# Patient Record
Sex: Female | Born: 1980 | Race: Black or African American | Hispanic: No | Marital: Single | Smoking: Current some day smoker
Health system: Southern US, Community
[De-identification: ages and names within clinical notes are randomized; demographics above are authoritative.]

## PROBLEM LIST (undated history)

## (undated) DIAGNOSIS — G43909 Migraine, unspecified, not intractable, without status migrainosus: Secondary | ICD-10-CM

## (undated) DIAGNOSIS — R569 Unspecified convulsions: Secondary | ICD-10-CM

## (undated) DIAGNOSIS — IMO0002 Reserved for concepts with insufficient information to code with codable children: Secondary | ICD-10-CM

## (undated) DIAGNOSIS — R87629 Unspecified abnormal cytological findings in specimens from vagina: Secondary | ICD-10-CM

## (undated) DIAGNOSIS — R42 Dizziness and giddiness: Secondary | ICD-10-CM

## (undated) DIAGNOSIS — F319 Bipolar disorder, unspecified: Secondary | ICD-10-CM

## (undated) HISTORY — DX: Unspecified abnormal cytological findings in specimens from vagina: R87.629

## (undated) HISTORY — PX: BREAST LUMPECTOMY: SHX2

## (undated) HISTORY — PX: BRAIN SURGERY: SHX531

## (undated) HISTORY — PX: ABDOMINAL HYSTERECTOMY: SHX81

---

## 2010-08-27 ENCOUNTER — Emergency Department (HOSPITAL_COMMUNITY): Admission: EM | Admit: 2010-08-27 | Discharge: 2010-08-27 | Payer: Self-pay | Admitting: Emergency Medicine

## 2010-11-25 ENCOUNTER — Emergency Department (HOSPITAL_COMMUNITY)
Admission: EM | Admit: 2010-11-25 | Discharge: 2010-11-25 | Payer: Self-pay | Source: Home / Self Care | Admitting: Emergency Medicine

## 2011-02-15 LAB — DIFFERENTIAL
Monocytes Relative: 7 % (ref 3–12)
Neutrophils Relative %: 64 % (ref 43–77)

## 2011-02-15 LAB — CBC
HCT: 39.1 % (ref 36.0–46.0)
Hemoglobin: 13.1 g/dL (ref 12.0–15.0)
MCH: 27.3 pg (ref 26.0–34.0)
MCHC: 33.5 g/dL (ref 30.0–36.0)
MCV: 81.5 fL (ref 78.0–100.0)
Platelets: 253 10*3/uL (ref 150–400)
RBC: 4.8 MIL/uL (ref 3.87–5.11)
RDW: 14.9 % (ref 11.5–15.5)
WBC: 8.6 10*3/uL (ref 4.0–10.5)

## 2011-02-15 LAB — URINALYSIS, ROUTINE W REFLEX MICROSCOPIC
Bilirubin Urine: NEGATIVE
Glucose, UA: NEGATIVE mg/dL
Ketones, ur: NEGATIVE mg/dL
Protein, ur: NEGATIVE mg/dL
Specific Gravity, Urine: 1.015 (ref 1.005–1.030)
pH: 7 (ref 5.0–8.0)

## 2011-02-15 LAB — BASIC METABOLIC PANEL
BUN: 7 mg/dL (ref 6–23)
GFR calc Af Amer: >60 mL/min (ref >60)
GFR calc non Af Amer: >60 mL/min (ref >60)

## 2011-02-18 LAB — POCT I-STAT, CHEM 8
BUN: 9 mg/dL (ref 6–23)
Chloride: 108 mEq/L (ref 96–112)
Creatinine, Ser: 0.9 mg/dL (ref 0.4–1.2)
Potassium: 3.7 mEq/L (ref 3.5–5.1)
Sodium: 142 mEq/L (ref 135–145)

## 2011-02-18 LAB — ETHANOL: Alcohol, Ethyl (B): <5 mg/dL (ref 0–10)

## 2011-05-07 ENCOUNTER — Emergency Department (HOSPITAL_COMMUNITY)
Admission: EM | Admit: 2011-05-07 | Discharge: 2011-05-08 | Disposition: A | Discharge status: Home / Self Care | Payer: BC Managed Care – PPO | Attending: Emergency Medicine | Admitting: Emergency Medicine

## 2011-05-07 DIAGNOSIS — R42 Dizziness and giddiness: Secondary | ICD-10-CM | POA: Insufficient documentation

## 2011-05-07 DIAGNOSIS — F319 Bipolar disorder, unspecified: Secondary | ICD-10-CM | POA: Insufficient documentation

## 2011-05-07 DIAGNOSIS — Z91199 Patient's noncompliance with other medical treatment and regimen due to unspecified reason: Secondary | ICD-10-CM | POA: Insufficient documentation

## 2011-05-07 DIAGNOSIS — Z9119 Patient's noncompliance with other medical treatment and regimen: Secondary | ICD-10-CM | POA: Insufficient documentation

## 2011-05-07 DIAGNOSIS — F411 Generalized anxiety disorder: Secondary | ICD-10-CM | POA: Insufficient documentation

## 2011-05-07 DIAGNOSIS — R569 Unspecified convulsions: Secondary | ICD-10-CM | POA: Insufficient documentation

## 2011-05-07 LAB — COMPREHENSIVE METABOLIC PANEL
Albumin: 3.9 g/dL (ref 3.5–5.2)
BUN: 16 mg/dL (ref 6–23)
CO2: 28 mEq/L (ref 19–32)
Calcium: 9.4 mg/dL (ref 8.4–10.5)
Creatinine, Ser: 0.98 mg/dL (ref 0.4–1.2)
GFR calc non Af Amer: >60 mL/min (ref >60)
Total Bilirubin: 0.2 mg/dL — ABNORMAL LOW (ref 0.3–1.2)
Total Protein: 7.3 g/dL (ref 6.0–8.3)

## 2011-05-07 LAB — URINALYSIS, ROUTINE W REFLEX MICROSCOPIC
Bilirubin Urine: NEGATIVE
Glucose, UA: NEGATIVE mg/dL
Hgb urine dipstick: NEGATIVE
Protein, ur: NEGATIVE mg/dL
Specific Gravity, Urine: 1.027 (ref 1.005–1.030)
Urobilinogen, UA: 0.2 mg/dL (ref 0.0–1.0)
pH: 6.5 (ref 5.0–8.0)

## 2011-05-07 LAB — DIFFERENTIAL
Eosinophils Absolute: 0.2 10*3/uL (ref 0.0–0.7)
Eosinophils Relative: 2 % (ref 0–5)
Monocytes Absolute: 0.8 10*3/uL (ref 0.1–1.0)
Monocytes Relative: 6 % (ref 3–12)
Neutrophils Relative %: 64 % (ref 43–77)

## 2011-05-07 LAB — CBC
HCT: 39.6 % (ref 36.0–46.0)
MCH: 27 pg (ref 26.0–34.0)
MCHC: 33.6 g/dL (ref 30.0–36.0)
Platelets: 258 10*3/uL (ref 150–400)
RBC: 4.93 MIL/uL (ref 3.87–5.11)
RDW: 14.8 % (ref 11.5–15.5)

## 2011-08-10 ENCOUNTER — Inpatient Hospital Stay (INDEPENDENT_AMBULATORY_CARE_PROVIDER_SITE_OTHER)
Admission: RE | Admit: 2011-08-10 | Discharge: 2011-08-10 | Disposition: A | Discharge status: Home / Self Care | Payer: BC Managed Care – PPO | Source: Ambulatory Visit | Attending: Emergency Medicine | Admitting: Emergency Medicine

## 2011-08-10 ENCOUNTER — Emergency Department (HOSPITAL_COMMUNITY)
Admission: EM | Admit: 2011-08-10 | Discharge: 2011-08-10 | Disposition: A | Discharge status: Home / Self Care | Payer: BC Managed Care – PPO | Attending: Emergency Medicine | Admitting: Emergency Medicine

## 2011-08-10 DIAGNOSIS — R51 Headache: Secondary | ICD-10-CM

## 2011-08-10 DIAGNOSIS — G43909 Migraine, unspecified, not intractable, without status migrainosus: Secondary | ICD-10-CM | POA: Insufficient documentation

## 2011-08-10 DIAGNOSIS — R42 Dizziness and giddiness: Secondary | ICD-10-CM

## 2011-08-10 DIAGNOSIS — R569 Unspecified convulsions: Secondary | ICD-10-CM | POA: Insufficient documentation

## 2011-08-11 ENCOUNTER — Emergency Department (HOSPITAL_COMMUNITY)
Admission: EM | Admit: 2011-08-11 | Discharge: 2011-08-11 | Disposition: A | Discharge status: Home / Self Care | Payer: BC Managed Care – PPO | Attending: Emergency Medicine | Admitting: Emergency Medicine

## 2011-08-11 DIAGNOSIS — R42 Dizziness and giddiness: Secondary | ICD-10-CM | POA: Insufficient documentation

## 2011-08-11 DIAGNOSIS — R110 Nausea: Secondary | ICD-10-CM | POA: Insufficient documentation

## 2011-08-11 DIAGNOSIS — G43909 Migraine, unspecified, not intractable, without status migrainosus: Secondary | ICD-10-CM | POA: Insufficient documentation

## 2011-08-11 DIAGNOSIS — G40909 Epilepsy, unspecified, not intractable, without status epilepticus: Secondary | ICD-10-CM | POA: Insufficient documentation

## 2011-08-11 DIAGNOSIS — Z79899 Other long term (current) drug therapy: Secondary | ICD-10-CM | POA: Insufficient documentation

## 2011-08-14 ENCOUNTER — Emergency Department (HOSPITAL_COMMUNITY)
Admission: EM | Admit: 2011-08-14 | Discharge: 2011-08-14 | Disposition: A | Discharge status: Home / Self Care | Payer: BC Managed Care – PPO | Attending: Emergency Medicine | Admitting: Emergency Medicine

## 2011-08-14 DIAGNOSIS — Z79899 Other long term (current) drug therapy: Secondary | ICD-10-CM | POA: Insufficient documentation

## 2011-08-14 DIAGNOSIS — R51 Headache: Secondary | ICD-10-CM | POA: Insufficient documentation

## 2011-08-14 DIAGNOSIS — G40909 Epilepsy, unspecified, not intractable, without status epilepticus: Secondary | ICD-10-CM | POA: Insufficient documentation

## 2011-08-14 DIAGNOSIS — R110 Nausea: Secondary | ICD-10-CM | POA: Insufficient documentation

## 2012-06-01 ENCOUNTER — Emergency Department (HOSPITAL_COMMUNITY): Payer: BC Managed Care – PPO

## 2012-06-01 ENCOUNTER — Encounter (HOSPITAL_COMMUNITY): Payer: Self-pay

## 2012-06-01 ENCOUNTER — Emergency Department (HOSPITAL_COMMUNITY)
Admission: EM | Admit: 2012-06-01 | Discharge: 2012-06-01 | Disposition: A | Discharge status: Home / Self Care | Payer: BC Managed Care – PPO | Attending: Emergency Medicine | Admitting: Emergency Medicine

## 2012-06-01 DIAGNOSIS — R42 Dizziness and giddiness: Secondary | ICD-10-CM | POA: Insufficient documentation

## 2012-06-01 DIAGNOSIS — Z79899 Other long term (current) drug therapy: Secondary | ICD-10-CM | POA: Insufficient documentation

## 2012-06-01 DIAGNOSIS — R55 Syncope and collapse: Secondary | ICD-10-CM

## 2012-06-01 HISTORY — DX: Unspecified convulsions: R56.9

## 2012-06-01 HISTORY — DX: Dizziness and giddiness: R42

## 2012-06-01 HISTORY — DX: Migraine, unspecified, not intractable, without status migrainosus: G43.909

## 2012-06-01 LAB — POCT I-STAT, CHEM 8
BUN: 9 mg/dL (ref 6–23)
Calcium, Ion: 1.31 mmol/L (ref 1.12–1.32)
Glucose, Bld: 66 mg/dL — ABNORMAL LOW (ref 70–99)
HCT: 42 % (ref 36.0–46.0)
Potassium: 3.9 mEq/L (ref 3.5–5.1)
Sodium: 143 mEq/L (ref 135–145)

## 2012-06-01 LAB — URINALYSIS, ROUTINE W REFLEX MICROSCOPIC
Ketones, ur: NEGATIVE mg/dL
Leukocytes, UA: NEGATIVE
pH: 7 (ref 5.0–8.0)

## 2012-06-01 MED ORDER — IOHEXOL 350 MG/ML SOLN
50.0000 mL | Freq: Once | INTRAVENOUS | Status: AC | PRN
  Administered 2012-06-01: 50 mL via INTRAVENOUS

## 2012-06-01 MED ORDER — MECLIZINE HCL 25 MG PO TABS
50.0000 mg | ORAL_TABLET | Freq: Once | ORAL | Status: AC
  Administered 2012-06-01: 50 mg via ORAL
  Filled 2012-06-01: qty 2

## 2012-06-01 NOTE — Discharge Instructions (Signed)
Please read and follow all provided instructions.  Your diagnoses today include:  1. Syncope     Tests performed today include:  Blood counts and electrolytes  CT of your head and neck - was normal  Vital signs. See below for your results today.   Medications prescribed:   None  Home care instructions:  Follow any educational materials contained in this packet.   Follow-up instructions: Please follow-up with your primary care provider in the next 3 days for further evaluation of your symptoms. If you do not have a primary care doctor -- see below for referral information.   Return instructions:   Please return to the Emergency Department if you experience worsening symptoms.   Return if you pass out again or have persistent vomiting  Return if you have worsening problems walking  Please return if you have any other emergent concerns.  Additional Information:  Your vital signs today were: BP 110/67  Pulse 85  Temp 98.5 F (36.9 C) (Oral)  Resp 16  SpO2 100% If your blood pressure (BP) was elevated above 135/85 this visit, please have this repeated by your doctor within one month. -------------- No Primary Care Doctor Call [facility]  [phone] Other agencies that provide inexpensive medical care    [facility]  [phone]    [facility]  [phone]    [facility]  [phone]    [facility]  [phone]    [facility]  [phone]    [facility]  [phone] -------------- RESOURCE GUIDE:  Dental Problems  Patients with Medicaid: [facility] [phone] [address]                                            [phone] [address] Phone:  [phone]                                                   Phone:  [phone]  If unable to pay or uninsured, contact:  [facility] or [facility] to become qualified for the adult dental clinic.  Chronic Pain  Problems Contact [facility]  [phone] Patients need to be referred by their primary care doctor.  Insufficient Money for Medicine Contact [employer]:  call "211" or [facility] [phone].  Psychological Services [facility]  [phone] [facility]  [phone] [facility]   [phone] (emergency services [phone])  Substance Abuse Resources [facility]  [phone] [facility] [phone] [facility] [place] [phone] Floydene Flock [phone] Residential & Outpatient Substance Abuse Program  [phone]  Abuse/Neglect [facility] Child Abuse Hotline [phone] [facility] Child Abuse Hotline [phone] (After Hours)  Emergency Shelter [facility] [phone]  [facility] [phone] [facility] [facility] [phone]  [facility] [place]  [facility] [address] [place]                       [address]      [address]  [place]                                                [place]                            [place] Phone:  [phone]                                   Phone:  [phone]                 Phone:  [phone]  [facility] Phone:  [phone]  [place] Child Abuse Hotline [phone]) 342-1394 [phone] (After Hours)    

## 2012-06-01 NOTE — ED Provider Notes (Signed)
Medical screening examination/treatment/procedure(s) were performed by non-physician practitioner and as supervising physician I was immediately available for consultation/collaboration.   Hubbert Landrigan A Maelynn Moroney, MD 06/01/12 1647 

## 2012-06-01 NOTE — ED Notes (Signed)
Pt reports approx 2 weeks of vertigo/dizziness symptoms with standing. Seen by PMD last week and had changes made to her migraine medications. States she took the week off work because she just wasn't feeling herself. Pt reports seizure at home two days ago. Unwitnessed. Pt reports she fell and did hit her head but was home alone, only with 29 yo daughter. Today went back to work and while standing became dizzy and had syncopal event. Pt reports changes to gait and walking into things. A&0x4. Neuro without deficit. Seizure precautions in place.

## 2012-06-01 NOTE — ED Notes (Signed)
Pt complains of syncopal episode today at work and headache, had a seizure 23 days ago.

## 2012-06-01 NOTE — ED Provider Notes (Signed)
History     CSN: [id number]  Arrival date & time 06/01/12  1048   First MD Initiated Contact with Patient 06/01/12 1149      Chief Complaint  Patient presents with  . Near Syncope    (Consider location/radiation/quality/duration/timing/severity/associated sxs/prior treatment) HPI Comments: Patient with a history of seizures, migraines and vertigo -- reports after a syncopal episode this morning at work. She states she had been feeling tired and "not herself" all morning before the episode. This was the patient's first day back at work for over a week because she had been having multiple episodes of vertigo and fatigue. The patient was standing at work, and the next thing that she remembers is waking up to coworkers standing over her. She felt an episode of vertigo before the episode. She reports that this did not seem like a seizure episode. She was able to stand up and walk around after the syncopal episode. She reports drinking a lot of water each day, and she does not feel dehydrated. She reports a mild headache currently. She denies vomiting, nausea, change in bowel or bladder habits, and CP or SOB. She denies cough, ear pain or fever. She denies weakness, numbness or tingling in her extremities. She reports a seizure 2 days ago that no one witnessed. Patient is taking Keppra as prescribed.   Patient is a 31 y.o. female presenting with syncope. The history is provided by the patient.  Loss of Consciousness This is a new problem. The current episode started today. The problem has been resolved. Associated symptoms include fatigue, headaches and vertigo. Pertinent negatives include no abdominal pain, change in bowel habit, chest pain, coughing, fever, myalgias, nausea, neck pain, rash, sore throat, urinary symptoms or vomiting. The symptoms are aggravated by standing. She has tried position changes for the symptoms. The treatment provided mild relief.    Past Medical History  Diagnosis Date    . Vertigo   . Migraine   . Seizure     No past surgical history on file.  No family history on file.  History  Substance Use Topics  . Smoking status: Not on file  . Smokeless tobacco: Not on file  . Alcohol Use:     OB History    Grav Para Term Preterm Abortions TAB SAB Ect Mult Living                  Review of Systems  Constitutional: Positive for fatigue. Negative for fever.  HENT: Negative for sore throat, rhinorrhea and neck pain.   Eyes: Negative for redness.  Respiratory: Negative for cough.   Cardiovascular: Positive for syncope. Negative for chest pain.  Gastrointestinal: Negative for nausea, vomiting, abdominal pain, diarrhea and change in bowel habit.  Genitourinary: Negative for dysuria.  Musculoskeletal: Negative for myalgias.  Skin: Negative for rash.  Neurological: Positive for dizziness (vertigo), vertigo, syncope and headaches. Negative for seizures (none today).    Allergies  Review of patient's allergies indicates no known allergies.  Home Medications   Current Outpatient Rx  Name Route Sig Dispense Refill  . CLONAZEPAM 1 MG PO TABS Oral Take 1 mg by mouth at bedtime.    Marland Kitchen ESTRADIOL 1 MG PO TABS Oral Take 1 mg by mouth daily.    Marland Kitchen LEVETIRACETAM 1000 MG PO TABS Oral Take 1,000 mg by mouth 2 (two) times daily.    . ADULT MULTIVITAMIN W/MINERALS CH Oral Take 1 tablet by mouth daily.    . QUETIAPINE FUMARATE 200  MG PO TABS Oral Take 200 mg by mouth at bedtime.    Marland Kitchen RIZATRIPTAN BENZOATE 10 MG PO TBDP Oral Take 10 mg by mouth as needed. May repeat in 2 hours if needed      BP 114/76  Pulse 108  Temp 98.5 F (36.9 C) (Oral)  Resp 18  SpO2 98%  Physical Exam  Nursing note and vitals reviewed. Constitutional: She is oriented to person, place, and time. She appears well-developed and well-nourished.  HENT:  Head: Normocephalic and atraumatic.  Right Ear: Tympanic membrane, external ear and ear canal normal.  Left Ear: Tympanic membrane,  external ear and ear canal normal.  Nose: Nose normal.  Mouth/Throat: Uvula is midline and oropharynx is clear and moist. Mucous membranes are dry.  Eyes: Conjunctivae are normal. Pupils are equal, round, and reactive to light. Right eye exhibits no discharge. Left eye exhibits no discharge. Right eye exhibits nystagmus (with rightward gaze). Right eye exhibits normal extraocular motion. Left eye exhibits nystagmus (with rightward gaze). Left eye exhibits normal extraocular motion.  Neck: Normal range of motion. Neck supple.  Cardiovascular: Normal rate, regular rhythm and normal heart sounds.   Pulmonary/Chest: Effort normal and breath sounds normal.  Abdominal: Soft. There is no tenderness.  Musculoskeletal: Normal range of motion. She exhibits no edema and no tenderness.  Neurological: She is alert and oriented to person, place, and time. She displays normal reflexes. No cranial nerve deficit. Coordination normal.  Skin: Skin is warm and dry.  Psychiatric: She has a normal mood and affect.    ED Course  Procedures (including critical care time)  Labs Reviewed - No data to display Ct Angio Head W/cm &/or Wo Cm  06/01/2012  *RADIOLOGY REPORT*  Clinical Data:  Syncope, vertigo, trouble walking  CT ANGIOGRAPHY HEAD AND NECK  Technique:  Multidetector CT imaging of the head and neck was performed using the standard protocol during bolus administration of intravenous contrast.  Multiplanar CT image reconstructions including MIPs were obtained to evaluate the vascular anatomy. Carotid stenosis measurements (when applicable) are obtained utilizing NASCET criteria, using the distal internal carotid diameter as the denominator.  Contrast: 50mL OMNIPAQUE IOHEXOL 350 MG/ML SOLN  Comparison:   None.  CTA NECK  Findings:  Standard branching of the aortic arch.  No significant stenosis involving the great vessels.  No mass is present in the neck.  Cervical spine is normal.  Right carotid:  Common carotid  artery is widely patent.  Carotid bifurcation widely patent.  Negative for dissection or stenosis.  Left carotid:  Normal common carotid  and  normal carotid bifurcation.  Negative for dissection or stenosis.  Vertebral arteries:  Both vertebral arteries are equal in size and widely patent to the basilar without stenosis or dissection.   Review of the MIP images confirms the above findings.  IMPRESSION: Negative CTA  neck.  CTA HEAD  Findings:  Ventricle size is normal.  Negative for infarct mass or hemorrhage.  No enhancing lesions are identified post contrast.  Both vertebral arteries are patent to the basilar.  PICA is patent bilaterally.  Superior cerebellar and posterior cerebral arteries are patent bilaterally without stenosis.  Internal carotid artery is patent bilaterally without stenosis. Anterior and middle cerebral arteries are patent bilaterally without stenosis.  Negative for cerebral aneurysm.   Review of the MIP images confirms the above findings.  IMPRESSION:  Normal  Original Report Authenticated By: Camelia Phenes, M.D.   Ct Angio Neck W/cm &/or Wo/cm  06/01/2012  *  RADIOLOGY REPORT*  Clinical Data:  Syncope, vertigo, trouble walking  CT ANGIOGRAPHY HEAD AND NECK  Technique:  Multidetector CT imaging of the head and neck was performed using the standard protocol during bolus administration of intravenous contrast.  Multiplanar CT image reconstructions including MIPs were obtained to evaluate the vascular anatomy. Carotid stenosis measurements (when applicable) are obtained utilizing NASCET criteria, using the distal internal carotid diameter as the denominator.  Contrast: 50mL OMNIPAQUE IOHEXOL 350 MG/ML SOLN  Comparison:   None.  CTA NECK  Findings:  Standard branching of the aortic arch.  No significant stenosis involving the great vessels.  No mass is present in the neck.  Cervical spine is normal.  Right carotid:  Common carotid artery is widely patent.  Carotid bifurcation widely patent.   Negative for dissection or stenosis.  Left carotid:  Normal common carotid  and  normal carotid bifurcation.  Negative for dissection or stenosis.  Vertebral arteries:  Both vertebral arteries are equal in size and widely patent to the basilar without stenosis or dissection.   Review of the MIP images confirms the above findings.  IMPRESSION: Negative CTA  neck.  CTA HEAD  Findings:  Ventricle size is normal.  Negative for infarct mass or hemorrhage.  No enhancing lesions are identified post contrast.  Both vertebral arteries are patent to the basilar.  PICA is patent bilaterally.  Superior cerebellar and posterior cerebral arteries are patent bilaterally without stenosis.  Internal carotid artery is patent bilaterally without stenosis. Anterior and middle cerebral arteries are patent bilaterally without stenosis.  Negative for cerebral aneurysm.   Review of the MIP images confirms the above findings.  IMPRESSION:  Normal  Original Report Authenticated By: Camelia Phenes, M.D.     1. Syncope     12:35 PM Patient seen and examined. HR=90's.  Vital signs reviewed and are as follows: Filed Vitals:   06/01/12 1105  BP: 114/76  Pulse: 108  Temp: 98.5 F (36.9 C)  Resp: 18   1:23 PM Pt ambulated to bathroom with difficulty walking per tech. She is having trouble walking straight and is bumping into walls. D/w Dr. Golda Acre. Will perform CT head, angio head/neck. Patient informed.    Date: 06/01/2012  Rate: 92  Rhythm: normal sinus rhythm  QRS Axis: normal  Intervals: normal  ST/T Wave abnormalities: normal  Conduction Disutrbances:none  Narrative Interpretation:   Old EKG Reviewed: unchanged 05/07/2011   CT's were negative. Pt informed. Ambulation attempted again and patient did well. She states she is ready to go home. I have urged patient to rest, drink plenty of fluids, return with worsening symptoms or trouble walking, or if she has any other concerns. Urged follow-up with her neurologist in  the next week. Patient verbalizes understanding and agrees with plan.     MDM  Syncope -- neg upreg, nml orthostatics, EKG neg.   Walking difficulty and nystagmus with vertigo was concerning for posterior circulation problem so CT angio neck and head was performed. These were negative. Patient ambulation improved prior to discharge.   Do not suspect cardiogenic or valvular cause of syncope, however there is not a clear etiology at this time. Symptoms are consistent with syncopal episode and not a seizure. Patient is stable and appears well at time of discharge.        [place], [place] 06/01/12 1645

## 2012-06-01 NOTE — ED Notes (Signed)
Went into do orthostatic vital signs but patient was on her way to CT

## 2012-06-04 ENCOUNTER — Emergency Department (HOSPITAL_COMMUNITY)
Admission: EM | Admit: 2012-06-04 | Discharge: 2012-06-04 | Disposition: A | Discharge status: Home / Self Care | Payer: BC Managed Care – PPO | Attending: Emergency Medicine | Admitting: Emergency Medicine

## 2012-06-04 ENCOUNTER — Encounter (HOSPITAL_COMMUNITY): Payer: Self-pay | Admitting: *Deleted

## 2012-06-04 DIAGNOSIS — F17200 Nicotine dependence, unspecified, uncomplicated: Secondary | ICD-10-CM | POA: Insufficient documentation

## 2012-06-04 DIAGNOSIS — G40909 Epilepsy, unspecified, not intractable, without status epilepticus: Secondary | ICD-10-CM | POA: Insufficient documentation

## 2012-06-04 DIAGNOSIS — Z7982 Long term (current) use of aspirin: Secondary | ICD-10-CM | POA: Insufficient documentation

## 2012-06-04 DIAGNOSIS — Z79899 Other long term (current) drug therapy: Secondary | ICD-10-CM | POA: Insufficient documentation

## 2012-06-04 LAB — CBC
Platelets: 231 10*3/uL (ref 150–400)
RBC: 4.9 MIL/uL (ref 3.87–5.11)
WBC: 10.7 10*3/uL — ABNORMAL HIGH (ref 4.0–10.5)

## 2012-06-04 LAB — BASIC METABOLIC PANEL
CO2: 25 mEq/L (ref 19–32)
Calcium: 9.4 mg/dL (ref 8.4–10.5)
Chloride: 103 mEq/L (ref 96–112)
Sodium: 138 mEq/L (ref 135–145)

## 2012-06-04 MED ORDER — HYDROCODONE-ACETAMINOPHEN 5-325 MG PO TABS
1.0000 | ORAL_TABLET | Freq: Once | ORAL | Status: AC
  Administered 2012-06-04: 1 via ORAL
  Filled 2012-06-04: qty 1

## 2012-06-04 NOTE — Discharge Instructions (Signed)
Epilepsy  People with epilepsy have times when they shake and jerk uncontrollably (seizures). This happens when there is a sudden change in brain function. Epilepsy may have many possible causes. Anything that disturbs the normal pattern of brain cell activity can lead to seizures.  HOME CARE    Listen to your doctor about driving and safety during normal activities.   Only take medicine as told by your doctor.   Take blood tests as told by your doctor.   Tell the people you live and work with that you have seizures. Make sure they know how to help you. They should:   Cushion your head and body.   Turn you on your side.   Not restrain you.   Not place anything inside your mouth.   Call for local emergency medical help if there is any question about what has happened.   Write down when your seizures happen and what you remember about each seizure. Write down anything you think may have caused the seizure to happen (trigger).   Keep all follow-up visits with your doctor. This is very important.  GET HELP RIGHT AWAY IF:    You get an infection or start to feel sick. You may have more seizures when you are sick.   You are having seizures more often.   Your seizure pattern is changing.   A seizure does not stop after a few seconds or minutes.   A seizure causes you to have trouble breathing.   A seizure gives you a very bad headache.   A seizure makes you unable to speak or use a part of your body.  MAKE SURE YOU:    Understand these instructions.   Will watch your condition.   Will get help right away if you are not doing well or get worse.  Document Released: 09/19/2009 Document Revised: 11/11/2011 Document Reviewed: 09/19/2009  Exit[employer] Patient Information 2012 [employer].

## 2012-06-04 NOTE — ED Notes (Signed)
Had seizure today-- hx of seizures

## 2012-06-04 NOTE — ED Notes (Addendum)
Pt from home with reports of an un witnessed seizure that happened at around 1330 today. Pt reports that 29 year old daughter was present and told pt that she was shaking. Pt also endorses headache and reports that she usually gets a migraine before she has a seizure. Pt endorses hx of grand mal seizures. Pt reports that last seizure was Wednesday and that she passed out at work on Friday and was taken to [facility] ED for tx. Pt reports that next appt with the Neurologist is 06/15/12. Pt is alert and oriented at present.

## 2012-06-04 NOTE — ED Provider Notes (Signed)
History     CSN: [id number]  Arrival date & time 06/04/12  1455   First MD Initiated Contact with Patient 06/04/12 1548      Chief Complaint  Patient presents with  . Seizures  . Headache     HPI Comments: Pt takes keppra 1000mg  bid.  She has been on this medication for three years.  Pt has an appointment with her neurologist on the 11th.  No fevers, cough.  No trouble urinating.  No infections.  Pt does have a headache now after the seizure.  Pt has noticed tingling in her legs when she sits certain positions but no weakness, slurred speech, neck pain or back pain.  Patient is a 31 y.o. female presenting with seizures and headaches. The history is provided by the patient.  Seizures  This is a recurrent problem. Episode onset: Pt had one last month, this past wednesday and then today. Associated symptoms include headaches. Pertinent negatives include no chest pain. The episode was witnessed. The seizures did not continue in the ED. Possible causes do not include med or dosage change, sleep deprivation, missed seizure meds or recent illness. There has been no fever.  Headache  Pertinent negatives include no fever.  Pt is back to her normal self.  Past Medical History  Diagnosis Date  . Vertigo   . Migraine   . Seizure     Past Surgical History  Procedure Date  . Abdominal hysterectomy   . Breast lumpectomy     bilateral    History reviewed. No pertinent family history.  History  Substance Use Topics  . Smoking status: Current Everyday Smoker -- 0.5 packs/day    Types: Cigarettes  . Smokeless tobacco: Never Used  . Alcohol Use: Yes     occ    OB History    Grav Para Term Preterm Abortions TAB SAB Ect Mult Living                  Review of Systems  Constitutional: Negative for fever.  HENT: Negative for neck pain.   Respiratory: Negative for choking.   Cardiovascular: Negative for chest pain.  Genitourinary: Negative for dysuria.  Skin: Negative for rash.    Neurological: Positive for seizures and headaches.    Allergies  Review of patient's allergies indicates no known allergies.  Home Medications   Current Outpatient Rx  Name Route Sig Dispense Refill  . ASPIRIN-ACETAMINOPHEN-CAFFEINE 250-250-65 MG PO TABS Oral Take 1 tablet by mouth every 6 (six) hours as needed. Pain    . CLONAZEPAM 1 MG PO TABS Oral Take 1 mg by mouth at bedtime.    Marland Kitchen ESTRADIOL 1 MG PO TABS Oral Take 1 mg by mouth daily.    Marland Kitchen LEVETIRACETAM 1000 MG PO TABS Oral Take 1,000 mg by mouth 2 (two) times daily.    Marland Kitchen MECLIZINE HCL 25 MG PO TABS Oral Take 25 mg by mouth 3 (three) times daily as needed. Dizziness    . ADULT MULTIVITAMIN W/MINERALS CH Oral Take 1 tablet by mouth daily.    . QUETIAPINE FUMARATE 200 MG PO TABS Oral Take 200 mg by mouth at bedtime.    Marland Kitchen RIZATRIPTAN BENZOATE 10 MG PO TBDP Oral Take 10 mg by mouth as needed. May repeat in 2 hours if needed      BP 108/62  Pulse 108  Temp 99.7 F (37.6 C) (Oral)  Resp 18  Ht 5\' 3"  (1.6 m)  Wt 150 lb (68.04 kg)  BMI 26.57 kg/m2  SpO2 99%  Physical Exam  Nursing note and vitals reviewed. Constitutional: She is oriented to person, place, and time. She appears well-developed and well-nourished. No distress.  HENT:  Head: Normocephalic and atraumatic.  Right Ear: External ear normal.  Left Ear: External ear normal.  Mouth/Throat: Oropharynx is clear and moist.  Eyes: Conjunctivae are normal. Right eye exhibits no discharge. Left eye exhibits no discharge. No scleral icterus.  Neck: Neck supple. No tracheal deviation present.  Cardiovascular: Normal rate, regular rhythm and intact distal pulses.   Pulmonary/Chest: Effort normal and breath sounds normal. No stridor. No respiratory distress. She has no wheezes. She has no rales.  Abdominal: Soft. Bowel sounds are normal. She exhibits no distension. There is no tenderness. There is no rebound and no guarding.  Musculoskeletal: She exhibits no edema and no  tenderness.  Neurological: She is alert and oriented to person, place, and time. She has normal strength. No cranial nerve deficit ( no gross defecits noted) or sensory deficit. She exhibits normal muscle tone. She displays no seizure activity. Coordination normal.       No pronator drift bilateral upper extrem, able to hold both legs off bed for 5 seconds, sensation intact in all extremities, no visual field cuts, no left or right sided neglect  Skin: Skin is warm and dry. No rash noted.  Psychiatric: She has a normal mood and affect.    ED Course  Procedures (including critical care time)  Labs Reviewed  CBC - Abnormal; Notable for the following:    WBC 10.7 (*)     All other components within normal limits  BASIC METABOLIC PANEL - Abnormal; Notable for the following:    Potassium 3.3 (*)     Glucose, Bld 109 (*)     All other components within normal limits  LEVETIRACETAM LEVEL   No results found.   1. Seizure disorder       MDM  Patient does not have any evidence of infection.  She has plans to see her neurologist.  I drew a keppra level and have instructed her to follow up with her doctor to review that.  At this time there does not appear to be any evidence of an acute emergency medical condition and the patient appears stable for discharge with appropriate outpatient follow up.         Celene Kras, MD 06/04/12 [phone]

## 2012-06-06 LAB — LEVETIRACETAM LEVEL: Levetiracetam Lvl: 51.3 ug/mL — ABNORMAL HIGH (ref 5.0–30.0)

## 2012-07-06 ENCOUNTER — Emergency Department (HOSPITAL_COMMUNITY): Payer: BC Managed Care – PPO

## 2012-07-06 ENCOUNTER — Emergency Department (HOSPITAL_COMMUNITY)
Admission: EM | Admit: 2012-07-06 | Discharge: 2012-07-06 | Disposition: A | Discharge status: Home / Self Care | Payer: BC Managed Care – PPO | Attending: Emergency Medicine | Admitting: Emergency Medicine

## 2012-07-06 ENCOUNTER — Encounter (HOSPITAL_COMMUNITY): Payer: Self-pay | Admitting: Emergency Medicine

## 2012-07-06 DIAGNOSIS — Z7982 Long term (current) use of aspirin: Secondary | ICD-10-CM | POA: Insufficient documentation

## 2012-07-06 DIAGNOSIS — R079 Chest pain, unspecified: Secondary | ICD-10-CM

## 2012-07-06 DIAGNOSIS — Z79899 Other long term (current) drug therapy: Secondary | ICD-10-CM | POA: Insufficient documentation

## 2012-07-06 DIAGNOSIS — R51 Headache: Secondary | ICD-10-CM

## 2012-07-06 DIAGNOSIS — R42 Dizziness and giddiness: Secondary | ICD-10-CM

## 2012-07-06 LAB — CBC WITH DIFFERENTIAL/PLATELET
Basophils Absolute: 0 10*3/uL (ref 0.0–0.1)
Basophils Relative: 0 % (ref 0–1)
Eosinophils Relative: 2 % (ref 0–5)
Lymphocytes Relative: 28 % (ref 12–46)
MCHC: 33.1 g/dL (ref 30.0–36.0)
MCV: 82.8 fL (ref 78.0–100.0)
Platelets: 232 10*3/uL (ref 150–400)
RDW: 14.7 % (ref 11.5–15.5)
WBC: 10.2 10*3/uL (ref 4.0–10.5)

## 2012-07-06 LAB — BASIC METABOLIC PANEL
BUN: 9 mg/dL (ref 6–23)
CO2: 23 mEq/L (ref 19–32)
Calcium: 8.9 mg/dL (ref 8.4–10.5)
Chloride: 109 mEq/L (ref 96–112)
Creatinine, Ser: 0.84 mg/dL (ref 0.50–1.10)
GFR calc Af Amer: >90 mL/min (ref >90)
GFR calc non Af Amer: >90 mL/min (ref >90)
Glucose, Bld: 80 mg/dL (ref 70–99)
Potassium: 3.7 mEq/L (ref 3.5–5.1)
Sodium: 141 mEq/L (ref 135–145)

## 2012-07-06 LAB — POCT I-STAT TROPONIN I: Troponin i, poc: 0 ng/mL (ref 0.00–0.08)

## 2012-07-06 LAB — TROPONIN I
Troponin I: <0.3 ng/mL (ref <0.30)
Troponin I: <0.3 ng/mL (ref <0.30)

## 2012-07-06 LAB — D-DIMER, QUANTITATIVE: D-Dimer, Quant: <0.22 ug/mL-FEU (ref 0.00–0.48)

## 2012-07-06 MED ORDER — SODIUM CHLORIDE 0.9 % IV SOLN
Freq: Once | INTRAVENOUS | Status: AC
  Administered 2012-07-06: 18:00:00 via INTRAVENOUS

## 2012-07-06 NOTE — ED Notes (Signed)
Called EMS chest pain 7/10 pressure with radiating tingling and numbness bilateral upper extremities. EMS inserted  LAC 20g and gave 2 nitrol SL with relief and 4 tablets baby aspirin.

## 2012-07-06 NOTE — ED Notes (Signed)
Patient stated for the past month having intermittent tingling and numbing sensation bilateral hands and bilateral feet.  Skin warm and dry currently no swelling present at this time. Moves all extremities bilateral equal and strong.

## 2012-07-06 NOTE — ED Provider Notes (Signed)
History     CSN: [id number]  Arrival date & time 07/06/12  1700   First MD Initiated Contact with Patient 07/06/12 1718      Chief Complaint  Patient presents with  . Chest Pain    (Consider location/radiation/quality/duration/timing/severity/associated sxs/prior treatment) HPI Comments: Natasha Hutchinson 31 y.o. female   The chief complaint is: Patient presents with:   Chest Pain  The patient has medical history significant for:   Past Medical History:   Vertigo                                                      Migraine                                                     Seizure                                                     The onset of the symptoms was  abrupt starting 3 hours ago,  The Course is  persistent, gradually improved nothing makes symptoms worse, nitro makes symptoms better Has associated  indigestion, headache, lightheadedness, "clammy hands" and numbness/tingling in her fingers and toes  Denies nausea, vomiting, diarrhea, fever, fatigue, syncope, weakness, abdominal pain loss of coordination.     The history is provided by the patient, medical records and the EMS personnel. History Limited By: none.   Natasha Hutchinson is a 31 y.o. female presents to the emergency room c/o chest pain, X 3 hrs.  It occurred while she was washing clothes.  It is described as a squeezing, nonradiating and was a 10/10.  She also had associated indigestion, headache, lightheadedness, shortness of breath and numbness/tingling in her fingers and toes.  Nothing makes the pain worse.  Nitro administered by EMS made the pain some better.  Now rated at a 3/10.  Dr Nadara Eaton with [facility] was seen for the first time on 06/21/12 to evaluate for syncope and she diagnosed with orthostatic hypotension and treated with compression stockings.  The headache began after nitro administration, is described as throbbing and also rated at a 10/10.  It is located behind her eyes and does not  radiate. Nothing makes it better or worse. Hx seizures, syncope, chiari malformation, vertigo, migraine.  Neurologist [place] at [facility].  She denies nausea, vomiting, diarrhea, fever, fatigue, syncope, weakness, abdominal pain loss of coordination.     Past Medical History  Diagnosis Date  . Vertigo   . Migraine   . Seizure     Past Surgical History  Procedure Date  . Abdominal hysterectomy   . Breast lumpectomy     bilateral    No family history on file.  History  Substance Use Topics  . Smoking status: Current Everyday Smoker -- 0.5 packs/day    Types: Cigarettes  . Smokeless tobacco: Never Used  . Alcohol Use: Yes     occ    OB History    Grav Para Term Preterm Abortions TAB SAB Ect Mult  Living                  Review of Systems  Constitutional: Positive for diaphoresis (clammy hands). Negative for fever, appetite change, fatigue and unexpected weight change.  HENT: Positive for neck pain (chronic). Negative for congestion, mouth sores, neck stiffness and sinus pressure.   Eyes: Negative for visual disturbance.  Respiratory: Positive for chest tightness and shortness of breath. Negative for cough and wheezing.   Cardiovascular: Positive for chest pain. Negative for palpitations and leg swelling.  Gastrointestinal: Negative for nausea, vomiting, abdominal pain, diarrhea and constipation.       Indigestion  Genitourinary: Negative for dysuria, urgency, frequency and hematuria.  Musculoskeletal: Negative for back pain.  Skin: Negative for rash.  Neurological: Positive for light-headedness and headaches. Negative for seizures, syncope, speech difficulty and weakness.       Tingling in bilateral upper extremity  Hematological: Does not bruise/bleed easily.  Psychiatric/Behavioral: Negative for disturbed wake/sleep cycle. The patient is not nervous/anxious.     Allergies  Shellfish allergy  Home Medications   Current Outpatient Rx    Name Route Sig Dispense Refill  . ASPIRIN-ACETAMINOPHEN-CAFFEINE 250-250-65 MG PO TABS Oral Take 1 tablet by mouth every 6 (six) hours as needed. Pain    . CLONAZEPAM 1 MG PO TABS Oral Take 1 mg by mouth at bedtime.    Marland Kitchen ESTRADIOL 1 MG PO TABS Oral Take 1 mg by mouth daily.    Marland Kitchen LEVETIRACETAM 1000 MG PO TABS Oral Take 1,000 mg by mouth 2 (two) times daily.    Marland Kitchen MECLIZINE HCL 25 MG PO TABS Oral Take 25 mg by mouth 3 (three) times daily as needed. Dizziness    . ADULT MULTIVITAMIN W/MINERALS CH Oral Take 1 tablet by mouth daily.    . QUETIAPINE FUMARATE 200 MG PO TABS Oral Take 200 mg by mouth at bedtime.    Marland Kitchen RIZATRIPTAN BENZOATE 10 MG PO TBDP Oral Take 10 mg by mouth as needed. For migraines. May repeat in 2 hours if needed.      BP 99/71  Pulse 60  Temp 98.2 F (36.8 C) (Oral)  Resp 17  SpO2 100%  Physical Exam  Nursing note and vitals reviewed. Constitutional: She is oriented to person, place, and time. She appears well-developed and well-nourished. No distress.  HENT:  Head: Normocephalic and atraumatic.  Mouth/Throat: Oropharynx is clear and moist. No oropharyngeal exudate.  Eyes: Conjunctivae and EOM are normal. Pupils are equal, round, and reactive to light. No scleral icterus.  Neck: Normal range of motion. Neck supple.  Cardiovascular: Normal rate, regular rhythm, normal heart sounds and intact distal pulses.  Exam reveals no gallop and no friction rub.   No murmur heard. Pulmonary/Chest: Effort normal and breath sounds normal. No respiratory distress. She has no wheezes.  Abdominal: Soft. Bowel sounds are normal. She exhibits no mass. There is no tenderness. There is no rebound and no guarding.  Musculoskeletal: Normal range of motion. She exhibits no edema.  Lymphadenopathy:    She has no cervical adenopathy.  Neurological: She is alert and oriented to person, place, and time. She has normal reflexes. No cranial nerve deficit. She exhibits normal muscle tone. Coordination  normal.       Speech is clear and goal oriented, follows commands Cranial nerves III - XII without deficit, no facial droop Normal strength in upper and lower extremities bilaterally, strong and equal grip strength Sensation normal to light touch Moves extremities without ataxia, coordination  intact No pronator drift   Skin: Skin is warm and dry. No rash noted. She is not diaphoretic.  Psychiatric: She has a normal mood and affect. Her behavior is normal. Judgment and thought content normal.    ED Course  Procedures (including critical care time)   Labs Reviewed  D-DIMER, QUANTITATIVE  PREGNANCY, URINE  TROPONIN I  CBC WITH DIFFERENTIAL  BASIC METABOLIC PANEL  TROPONIN I  POCT I-STAT TROPONIN I    Results for orders placed during the hospital encounter of 07/06/12  D-DIMER, QUANTITATIVE      Component Value Range   D-Dimer, Quant <0.22  0.00 - 0.48 ug/mL-FEU  PREGNANCY, URINE      Component Value Range   Preg Test, Ur NEGATIVE  NEGATIVE  TROPONIN I      Component Value Range   Troponin I <0.30  <0.30 ng/mL  CBC WITH DIFFERENTIAL      Component Value Range   WBC 10.2  4.0 - 10.5 K/uL   RBC 4.37  3.87 - 5.11 MIL/uL   Hemoglobin 12.0  12.0 - 15.0 g/dL   HCT 16.1  09.6 - 04.5 %   MCV 82.8  78.0 - 100.0 fL   MCH 27.5  26.0 - 34.0 pg   MCHC 33.1  30.0 - 36.0 g/dL   RDW 40.9  81.1 - 91.4 %   Platelets 232  150 - 400 K/uL   Neutrophils Relative 64  43 - 77 %   Neutro Abs 6.6  1.7 - 7.7 K/uL   Lymphocytes Relative 28  12 - 46 %   Lymphs Abs 2.8  0.7 - 4.0 K/uL   Monocytes Relative 6  3 - 12 %   Monocytes Absolute 0.6  0.1 - 1.0 K/uL   Eosinophils Relative 2  0 - 5 %   Eosinophils Absolute 0.2  0.0 - 0.7 K/uL   Basophils Relative 0  0 - 1 %   Basophils Absolute 0.0  0.0 - 0.1 K/uL  BASIC METABOLIC PANEL      Component Value Range   Sodium 141  135 - 145 mEq/L   Potassium 3.7  3.5 - 5.1 mEq/L   Chloride 109  96 - 112 mEq/L   CO2 23  19 - 32 mEq/L   Glucose, Bld 80   70 - 99 mg/dL   BUN 9  6 - 23 mg/dL   Creatinine, Ser 7.82  0.50 - 1.10 mg/dL   Calcium 8.9  8.4 - 95.6 mg/dL   GFR calc non Af Amer >90  >90 mL/min   GFR calc Af Amer >90  >90 mL/min  TROPONIN I      Component Value Range   Troponin I <0.30  <0.30 ng/mL  POCT I-STAT TROPONIN I      Component Value Range   Troponin i, poc 0.00  0.00 - 0.08 ng/mL   Comment 3            Dg Chest 2 View  07/06/2012  *RADIOLOGY REPORT*  Clinical Data: Chest pain  CHEST - 2 VIEW  Comparison: 11/25/2010  Findings: The cardiac shadow is stable.  The lungs are clear bilaterally.  No pneumothorax or pleural effusion is seen.  No acute bony abnormality is noted.  IMPRESSION: No acute intrathoracic abnormality.  Original Report Authenticated By: Phillips Odor, M.D.   ECG:  Date: 07/06/2012  Rate: 68   Rhythm: normal sinus rhythm  QRS Axis: normal  Intervals: normal  ST/T Wave abnormalities: normal  Conduction Disutrbances:none  Narrative Interpretation: Normal Sinus Rhythm  Old EKG Reviewed: unchanged   1. Chest pain on exertion   2. Headache     MDM  Newell Coral presents with CP and associated symptoms.  Her ECG is without abnormality.  Her TIMI score is 0 and her Heart Score is also 0, therefore her risk of an ACS is low.  Her Wells Criteria score is 0 making her a low risk for a PE, but her PERC is not negative 2/2 to her estradiol use.  Her troponin, d-dimer are both negative. Her repeat troponin remains negative.  Her CBC and BMP are both unremarkable.  Based on her low risk and negative testing, I believe she is not having an ACS or PE.  I would recommend close follow-up with cardiology at this time for further evaluation.    1. Medications: usual home meds 2. Treatment: rest, hydration 3. Follow Up: with cardiology as soon as possible          Dierdre Forth, PA-C 07/06/12 2245

## 2012-07-06 NOTE — ED Notes (Signed)
Pt. States "chest pain has subsided".

## 2012-07-06 NOTE — ED Provider Notes (Signed)
Medical screening examination/treatment/procedure(s) were conducted as a shared visit with non-physician practitioner(s) and myself.  I personally evaluated the patient during the encounter   [name], MD 07/06/12 2311 

## 2012-10-01 ENCOUNTER — Encounter (HOSPITAL_COMMUNITY): Payer: Self-pay

## 2012-10-01 ENCOUNTER — Emergency Department (HOSPITAL_COMMUNITY): Payer: BC Managed Care – PPO

## 2012-10-01 ENCOUNTER — Emergency Department (HOSPITAL_COMMUNITY)
Admission: EM | Admit: 2012-10-01 | Discharge: 2012-10-01 | Disposition: A | Discharge status: Home / Self Care | Payer: BC Managed Care – PPO | Attending: Emergency Medicine | Admitting: Emergency Medicine

## 2012-10-01 DIAGNOSIS — Z79899 Other long term (current) drug therapy: Secondary | ICD-10-CM | POA: Insufficient documentation

## 2012-10-01 DIAGNOSIS — G43909 Migraine, unspecified, not intractable, without status migrainosus: Secondary | ICD-10-CM | POA: Insufficient documentation

## 2012-10-01 DIAGNOSIS — F319 Bipolar disorder, unspecified: Secondary | ICD-10-CM | POA: Insufficient documentation

## 2012-10-01 DIAGNOSIS — R42 Dizziness and giddiness: Secondary | ICD-10-CM | POA: Insufficient documentation

## 2012-10-01 DIAGNOSIS — G40909 Epilepsy, unspecified, not intractable, without status epilepticus: Secondary | ICD-10-CM | POA: Insufficient documentation

## 2012-10-01 DIAGNOSIS — F17200 Nicotine dependence, unspecified, uncomplicated: Secondary | ICD-10-CM | POA: Insufficient documentation

## 2012-10-01 DIAGNOSIS — R569 Unspecified convulsions: Secondary | ICD-10-CM

## 2012-10-01 DIAGNOSIS — M25539 Pain in unspecified wrist: Secondary | ICD-10-CM | POA: Insufficient documentation

## 2012-10-01 DIAGNOSIS — G935 Compression of brain: Secondary | ICD-10-CM | POA: Insufficient documentation

## 2012-10-01 HISTORY — DX: Bipolar disorder, unspecified: F31.9

## 2012-10-01 HISTORY — DX: Reserved for concepts with insufficient information to code with codable children: IMO0002

## 2012-10-01 LAB — BASIC METABOLIC PANEL
BUN: 14 mg/dL (ref 6–23)
CO2: 18 mEq/L — ABNORMAL LOW (ref 19–32)
Chloride: 109 mEq/L (ref 96–112)
Creatinine, Ser: 0.81 mg/dL (ref 0.50–1.10)
Glucose, Bld: 81 mg/dL (ref 70–99)

## 2012-10-01 LAB — URINALYSIS, ROUTINE W REFLEX MICROSCOPIC
Bilirubin Urine: NEGATIVE
Glucose, UA: NEGATIVE mg/dL
Ketones, ur: NEGATIVE mg/dL
Leukocytes, UA: NEGATIVE
pH: 7 (ref 5.0–8.0)

## 2012-10-01 LAB — CBC
HCT: 39.5 % (ref 36.0–46.0)
MCV: 81.1 fL (ref 78.0–100.0)
RBC: 4.87 MIL/uL (ref 3.87–5.11)
WBC: 11.6 10*3/uL — ABNORMAL HIGH (ref 4.0–10.5)

## 2012-10-01 MED ORDER — POTASSIUM CHLORIDE CRYS ER 20 MEQ PO TBCR
40.0000 meq | EXTENDED_RELEASE_TABLET | Freq: Once | ORAL | Status: AC
  Administered 2012-10-01: 40 meq via ORAL
  Filled 2012-10-01 (×2): qty 1

## 2012-10-01 MED ORDER — IBUPROFEN 800 MG PO TABS
800.0000 mg | ORAL_TABLET | Freq: Once | ORAL | Status: AC
  Administered 2012-10-01: 800 mg via ORAL
  Filled 2012-10-01: qty 1

## 2012-10-01 NOTE — ED Provider Notes (Signed)
History     CSN: [id number]  Arrival date & time 10/01/12  9562   First MD Initiated Contact with Patient 10/01/12 [phone]      Chief Complaint  Patient presents with  . Headache    (Consider location/radiation/quality/duration/timing/severity/associated sxs/prior treatment) HPI Comments: 31 year old female with a history of seizures (currently compliant on Keppra 1000 twice a day), bipolar 1, chiari formation, and migraines presents to the emergency department complaining of seizure.  Episode occurred approximately 8 a.m. this morning and was witnessed by a friend.  Seizure lasted 30 seconds to 1 minute with tonic-clonic shaking.  Witness is unaware of eye movement or details about seizure and states she panicked because she is never would've something like this before.  Last seizure occurred in June.  Patient denies incontinence.  Recent history includes drinking last night, vodka orange juice x2.  Patient denies it precipitating or a and states that there was loss of consciousness.  Patient is currently being followed by Dr. Clarisse Gouge, [facility] with her next appointment on November 12.  Associated symptoms include post ictal headache.  Patient states that one episode occurred she fell out of bed landing on her right wrist and she is currently having wrist pain.  She denies change in vision, ataxia, disequilibrium, nausea, vomiting, increased stress, decreased sleep, medication noncompliance, fever, recent illness, night sweats or chills.  Patient has no other complaints this time.  The history is provided by the patient.    Past Medical History  Diagnosis Date  . Vertigo   . Migraine   . Seizure   . Chiari malformation   . Bipolar 1 disorder     Past Surgical History  Procedure Date  . Abdominal hysterectomy   . Breast lumpectomy     bilateral    History reviewed. No pertinent family history.  History  Substance Use Topics  . Smoking status: Current  Some Day Smoker -- 0.5 packs/day    Types: Cigarettes  . Smokeless tobacco: Never Used  . Alcohol Use: Yes     occ    OB History    Grav Para Term Preterm Abortions TAB SAB Ect Mult Living                  Review of Systems  Constitutional: Negative for fever, chills and appetite change.  HENT: Negative for congestion, neck pain and neck stiffness.   Eyes: Negative for visual disturbance.  Respiratory: Negative for shortness of breath.   Cardiovascular: Negative for chest pain and leg swelling.  Gastrointestinal: Negative for abdominal pain.  Genitourinary: Negative for dysuria, urgency and frequency.  Neurological: Positive for seizures and headaches. Negative for dizziness, syncope, weakness, light-headedness and numbness.  Psychiatric/Behavioral: Negative for confusion.  All other systems reviewed and are negative.    Allergies  Shellfish allergy  Home Medications   Current Outpatient Rx  Name Route Sig Dispense Refill  . CLONAZEPAM 1 MG PO TABS Oral Take 1 mg by mouth at bedtime.    Marland Kitchen ESTRADIOL 1 MG PO TABS Oral Take 1 mg by mouth daily.    Marland Kitchen HYDROCODONE-ACETAMINOPHEN 5-325 MG PO TABS Oral Take 1 tablet by mouth every 6 (six) hours as needed. For pain.    Marland Kitchen KETOPROFEN 75 MG PO CAPS Oral Take 75 mg by mouth 4 (four) times daily as needed. For headache.    Marland Kitchen LEVETIRACETAM 1000 MG PO TABS Oral Take 1,000 mg by mouth 2 (two) times daily.    Marland Kitchen  MECLIZINE HCL 25 MG PO TABS Oral Take 25 mg by mouth 3 (three) times daily as needed. Dizziness    . ADULT MULTIVITAMIN W/MINERALS CH Oral Take 1 tablet by mouth daily.    . QUETIAPINE FUMARATE 200 MG PO TABS Oral Take 200 mg by mouth at bedtime.    Marland Kitchen RIZATRIPTAN BENZOATE 10 MG PO TBDP Oral Take 10 mg by mouth as needed. For migraines. May repeat in 2 hours if needed.    . TOPIRAMATE 200 MG PO TABS Oral Take 200 mg by mouth daily.      BP 98/58  Pulse 81  Temp 99.6 F (37.6 C) (Oral)  Resp 16  Ht 5\' 3"  (1.6 m)  Wt 155 lb  (70.308 kg)  BMI 27.46 kg/m2  SpO2 99%  Physical Exam  Nursing note and vitals reviewed. Constitutional: She appears well-developed and well-nourished. No distress.       No altered mentation, does not appear post ictal  HENT:  Head: Normocephalic.       Atraumatic, No evidence of tongue oral lacerations.   Eyes: EOM are normal. Pupils are equal, round, and reactive to light.       No ttp over superior or inferior orbits, no entrapment w EOMs  Neck: Normal range of motion. Neck supple.       Cervical spinous process non tender without step offs, no difficulty or pain with flexion or extension of neck  Cardiovascular: Normal rate, regular rhythm, normal heart sounds and intact distal pulses.   Pulmonary/Chest: Breath sounds normal. No respiratory distress. She has no wheezes. She has no rales.  Abdominal: Soft. There is no tenderness.  Musculoskeletal: She exhibits no edema and no tenderness.       Full active & passive ROM of arms bilaterally  Neurological:       CN III-VII intact. Iintact coordination, sensation, and motor (finger grip, biceps, hamstrings & dorsiflexion). No pass pointing, good rapid coordination. Gait normal.   Skin: Skin is warm and dry. She is not diaphoretic.       intact    ED Course  Procedures (including critical care time)  Labs Reviewed  CBC - Abnormal; Notable for the following:    WBC 11.6 (*)     All other components within normal limits  BASIC METABOLIC PANEL - Abnormal; Notable for the following:    Potassium 3.4 (*)     CO2 18 (*)     All other components within normal limits  URINALYSIS, ROUTINE W REFLEX MICROSCOPIC  LEVETIRACETAM LEVEL   Dg Wrist Complete Right  10/01/2012  *RADIOLOGY REPORT*  Clinical Data: Wrist pain, fall  RIGHT WRIST - COMPLETE 3+ VIEW  Comparison: None.  Findings: No fracture of distal radius or ulna.  Radiocarpal joint is normal.  No carpal fracture.  IMPRESSION: No right wrist fracture.   Original Report  Authenticated By: Genevive Bi, M.D.      No diagnosis found.    MDM  Seizure  Patient with no evidence of focal neuro deficits on physical exam and is at mental baseline.  Labs reviewed, brain imaging not indicated at this time as there are no new symptoms or head trauma.  Patient is advised to keep her followup with neurologist that is scheduled for Oct 17, 2012.  Spoke with patient in detail about driving restrictions until cleared by a neurologist.  Patient verbalizes understanding.  Answered all questions.  Patient is hemodynamically stable and in no acute distress prior to discharge.  Jaci Carrel, [place] 10/01/12 1326

## 2012-10-01 NOTE — ED Notes (Signed)
Patient reports a witnessed a seizure with the patient and reported that the seizure lasted approx 30 seconds to 1 minute. Patient states she hit her head on the night stand. Patient is now c/o pain over right eyebrow area and right wrist area. Patient states she is compliant with her seizure medication. Patient states she has not had a seizure in a long time.

## 2012-10-01 NOTE — ED Notes (Signed)
PA at bedside.

## 2012-10-01 NOTE — ED Provider Notes (Signed)
Medical screening examination/treatment/procedure(s) were performed by non-physician practitioner and as supervising physician I was immediately available for consultation/collaboration.  Hadlee Burback, MD 10/01/12 1605 

## 2013-05-15 ENCOUNTER — Emergency Department (HOSPITAL_COMMUNITY)
Admission: EM | Admit: 2013-05-15 | Discharge: 2013-05-15 | Disposition: A | Discharge status: Home / Self Care | Payer: Medicaid Other | Attending: Emergency Medicine | Admitting: Emergency Medicine

## 2013-05-15 ENCOUNTER — Encounter (HOSPITAL_COMMUNITY): Payer: Self-pay | Admitting: Emergency Medicine

## 2013-05-15 DIAGNOSIS — H01009 Unspecified blepharitis unspecified eye, unspecified eyelid: Secondary | ICD-10-CM | POA: Insufficient documentation

## 2013-05-15 DIAGNOSIS — J309 Allergic rhinitis, unspecified: Secondary | ICD-10-CM | POA: Insufficient documentation

## 2013-05-15 DIAGNOSIS — J302 Other seasonal allergic rhinitis: Secondary | ICD-10-CM

## 2013-05-15 DIAGNOSIS — H01003 Unspecified blepharitis right eye, unspecified eyelid: Secondary | ICD-10-CM

## 2013-05-15 DIAGNOSIS — Z79899 Other long term (current) drug therapy: Secondary | ICD-10-CM | POA: Insufficient documentation

## 2013-05-15 DIAGNOSIS — F17200 Nicotine dependence, unspecified, uncomplicated: Secondary | ICD-10-CM | POA: Insufficient documentation

## 2013-05-15 DIAGNOSIS — Q054 Unspecified spina bifida with hydrocephalus: Secondary | ICD-10-CM | POA: Insufficient documentation

## 2013-05-15 DIAGNOSIS — G40909 Epilepsy, unspecified, not intractable, without status epilepticus: Secondary | ICD-10-CM | POA: Insufficient documentation

## 2013-05-15 DIAGNOSIS — Z8679 Personal history of other diseases of the circulatory system: Secondary | ICD-10-CM | POA: Insufficient documentation

## 2013-05-15 DIAGNOSIS — F319 Bipolar disorder, unspecified: Secondary | ICD-10-CM | POA: Insufficient documentation

## 2013-05-15 MED ORDER — LORATADINE 10 MG PO TABS: 10.0000 mg | ORAL_TABLET | Freq: Every day | ORAL | Status: DC

## 2013-05-15 NOTE — ED Provider Notes (Signed)
History     CSN: [id number]  Arrival date & time 05/15/13  1019   First MD Initiated Contact with Patient 05/15/13 1042      Chief Complaint  Patient presents with  . Eye Problem    (Consider location/radiation/quality/duration/timing/severity/associated sxs/prior treatment) HPI Comments: Patient is a 32 year old female who presents for bilateral eyelid swelling with onset upon waking this morning. Patient states symptoms have been gradually improving throughout the day without any aggravating or specific alleviating factors. Patient is to associated mild bilateral eye redness as well as a burning and itching sensation and eye watering. Patient denies fever, vision changes, pain with eye movement, nasal congestion or rhinorrhea, cough, sore throat, ear pain or discharge, and shortness of breath.  Patient is a 32 y.o. female presenting with eye problem. The history is provided by the patient. No language interpreter was used.  Eye Problem Associated symptoms: discharge (clear, watery), itching and redness (mild)   Associated symptoms: no nausea, no photophobia and no vomiting     Past Medical History  Diagnosis Date  . Vertigo   . Migraine   . Seizure   . Chiari malformation   . Bipolar 1 disorder     Past Surgical History  Procedure Laterality Date  . Abdominal hysterectomy    . Breast lumpectomy      bilateral    History reviewed. No pertinent family history.  History  Substance Use Topics  . Smoking status: Current Some Day Smoker -- 0.50 packs/day    Types: Cigarettes  . Smokeless tobacco: Never Used  . Alcohol Use: Yes     Comment: occ    OB History   Grav Para Term Preterm Abortions TAB SAB Ect Mult Living                  Review of Systems  Constitutional: Negative for fever.  HENT: Negative for congestion, sore throat and rhinorrhea.   Eyes: Positive for discharge (clear, watery), redness (mild) and itching. Negative for photophobia, pain and visual  disturbance.  Respiratory: Negative for cough and shortness of breath.   Gastrointestinal: Negative for nausea and vomiting.  Skin: Negative for rash.  All other systems reviewed and are negative.    Allergies  Shellfish allergy  Home Medications   Current Outpatient Rx  Name  Route  Sig  Dispense  Refill  . estradiol (ESTRACE) 1 MG tablet   Oral   Take 1 mg by mouth daily.         Marland Kitchen levETIRAcetam (KEPPRA) 1000 MG tablet   Oral   Take 1,000 mg by mouth 2 (two) times daily.         . QUEtiapine (SEROQUEL) 200 MG tablet   Oral   Take 200 mg by mouth at bedtime.         Marland Kitchen loratadine (CLARITIN) 10 MG tablet   Oral   Take 1 tablet (10 mg total) by mouth daily.   15 tablet   0     BP 132/80  Pulse 102  Temp(Src) 98.4 F (36.9 C) (Oral)  Resp 18  SpO2 100%  Physical Exam  Nursing note and vitals reviewed. Constitutional: She is oriented to person, place, and time. She appears well-developed and well-nourished. No distress.  HENT:  Head: Normocephalic and atraumatic.  Right Ear: Tympanic membrane, external ear and ear canal normal. No mastoid tenderness.  Left Ear: Tympanic membrane, external ear and ear canal normal. No mastoid tenderness.  Nose: Nose normal.  Mouth/Throat:  Uvula is midline and oropharynx is clear and moist. No oropharyngeal exudate.  Eyes: EOM and lids are normal. Pupils are equal, round, and reactive to light. No foreign bodies found. Right eye exhibits no discharge. Left eye exhibits no discharge. Right conjunctiva is injected (mild). Right conjunctiva has no hemorrhage. Left conjunctiva is injected (mild). Left conjunctiva has no hemorrhage. No scleral icterus.  + mild b/l upper lid blepharitis without erythema; outer edges of b/l conjunctiva mildly injected.   Neck: Normal range of motion. Neck supple.  Cardiovascular: Normal rate, regular rhythm and intact distal pulses.   Pulmonary/Chest: Effort normal. No respiratory distress.   Musculoskeletal: Normal range of motion.  Lymphadenopathy:    She has no cervical adenopathy.  Neurological: She is alert and oriented to person, place, and time.  Skin: Skin is warm and dry. She is not diaphoretic.  Psychiatric: She has a normal mood and affect. Her behavior is normal.    ED Course  Procedures (including critical care time)  Labs Reviewed - No data to display No results found.   1. Blepharitis of both eyes   2. Seasonal allergies     MDM  Patient presents for b/l eyelid swelling with eye itching and watery d/c with onset this AM. Patient states symptoms resolving since onset and that she "spent a lot of time outside yesterday". Snellen 20/15 OS, OD, OU. Symptoms most c/w seasonal allergies. Patient appropriate for d/c with PCP follow up and Rx for Claritin for symptom relief. Patient told she may also use OTC artificial tears for lubrication. Indications for ED return discussed. Patient verbalizes comfort and understanding with plan with no unaddressed concerns.        Antony Madura, PA-C 05/18/13 1857

## 2013-05-15 NOTE — ED Notes (Signed)
Pt c/o bilateral eye swelling and irritation starting this am

## 2013-05-15 NOTE — ED Notes (Signed)
Visual Acuity completed.

## 2013-05-19 NOTE — ED Provider Notes (Signed)
Medical screening examination/treatment/procedure(s) were performed by non-physician practitioner and as supervising physician I was immediately available for consultation/collaboration.   Jude Linck III, MD 05/19/13 1145 

## 2013-10-15 ENCOUNTER — Emergency Department (HOSPITAL_COMMUNITY)
Admission: EM | Admit: 2013-10-15 | Discharge: 2013-10-15 | Disposition: A | Discharge status: Home / Self Care | Payer: Self-pay | Source: Home / Self Care | Attending: Family Medicine | Admitting: Family Medicine

## 2013-10-15 DIAGNOSIS — K5289 Other specified noninfective gastroenteritis and colitis: Secondary | ICD-10-CM

## 2013-10-15 DIAGNOSIS — K529 Noninfective gastroenteritis and colitis, unspecified: Secondary | ICD-10-CM

## 2013-10-15 MED ORDER — ONDANSETRON 4 MG PO TBDP
ORAL_TABLET | ORAL | Status: AC
  Filled 2013-10-15: qty 2

## 2013-10-15 MED ORDER — ONDANSETRON HCL 4 MG PO TABS: 4.0000 mg | ORAL_TABLET | Freq: Four times a day (QID) | ORAL | Status: DC

## 2013-10-15 MED ORDER — DIPHENOXYLATE-ATROPINE 2.5-0.025 MG PO TABS: 2.0000 | ORAL_TABLET | Freq: Three times a day (TID) | ORAL | Status: DC

## 2013-10-15 MED ORDER — ONDANSETRON 4 MG PO TBDP
8.0000 mg | ORAL_TABLET | Freq: Once | ORAL | Status: AC
  Administered 2013-10-15: 8 mg via ORAL

## 2013-10-15 NOTE — ED Notes (Signed)
C/o diarrhea and vomiting since 10/12/13 States she has tried OTC medications, fluids, rest and anti-nausea meds but no relief.  PCP was contacted but was told to get the above medications.

## 2013-10-15 NOTE — ED Provider Notes (Signed)
CSN: [id number]     Arrival date & time 10/15/13  1759 History   First MD Initiated Contact with Patient 10/15/13 1858     Chief Complaint  Patient presents with  . Emesis  . Diarrhea   (Consider location/radiation/quality/duration/timing/severity/associated sxs/prior Treatment) Patient is a 32 y.o. female presenting with vomiting. The history is provided by the patient.  Emesis Severity:  Mild Duration:  3 days Timing:  Sporadic Quality:  Stomach contents Progression:  Unchanged Chronicity:  New Recent urination:  Normal Ineffective treatments:  Antiemetics Associated symptoms: diarrhea   Associated symptoms: no abdominal pain   Risk factors: no sick contacts, no suspect food intake and no travel to endemic areas     Past Medical History  Diagnosis Date  . Vertigo   . Migraine   . Seizure   . Chiari malformation   . Bipolar 1 disorder    Past Surgical History  Procedure Laterality Date  . Abdominal hysterectomy    . Breast lumpectomy      bilateral   No family history on file. History  Substance Use Topics  . Smoking status: Current Some Day Smoker -- 0.50 packs/day    Types: Cigarettes  . Smokeless tobacco: Never Used  . Alcohol Use: Yes     Comment: occ   OB History   Grav Para Term Preterm Abortions TAB SAB Ect Mult Living                 Review of Systems  Constitutional: Negative.   Gastrointestinal: Positive for nausea, vomiting and diarrhea. Negative for abdominal pain and blood in stool.  Genitourinary: Negative.     Allergies  Shellfish allergy  Home Medications   Current Outpatient Rx  Name  Route  Sig  Dispense  Refill  . diphenoxylate-atropine (LOMOTIL) 2.5-0.025 MG per tablet   Oral   Take 2 tablets by mouth 3 (three) times daily.   20 tablet   0   . estradiol (ESTRACE) 1 MG tablet   Oral   Take 1 mg by mouth daily.         Marland Kitchen levETIRAcetam (KEPPRA) 1000 MG tablet   Oral   Take 1,000 mg by mouth 2 (two) times daily.          Marland Kitchen loratadine (CLARITIN) 10 MG tablet   Oral   Take 1 tablet (10 mg total) by mouth daily.   15 tablet   0   . ondansetron (ZOFRAN) 4 MG tablet   Oral   Take 1 tablet (4 mg total) by mouth every 6 (six) hours.   8 tablet   0   . QUEtiapine (SEROQUEL) 200 MG tablet   Oral   Take 200 mg by mouth at bedtime.          BP 114/65  Pulse 89  Temp(Src) 98.2 F (36.8 C) (Oral)  Resp 16  SpO2 100% Physical Exam  Nursing note and vitals reviewed. Constitutional: She is oriented to person, place, and time. She appears well-developed and well-nourished.  HENT:  Head: Normocephalic.  Right Ear: External ear normal.  Left Ear: External ear normal.  Mouth/Throat: Oropharynx is clear and moist.  Eyes: Pupils are equal, round, and reactive to light.  Neck: Normal range of motion. Neck supple.  Cardiovascular: Normal rate.   Pulmonary/Chest: Breath sounds normal.  Lymphadenopathy:    She has no cervical adenopathy.  Neurological: She is alert and oriented to person, place, and time.  Skin: Skin is warm and dry.  ED Course  Procedures (including critical care time) Labs Review Labs Reviewed - No data to display Imaging Review No results found.  EKG Interpretation     Ventricular Rate:    PR Interval:    QRS Duration:   QT Interval:    QTC Calculation:   R Axis:     Text Interpretation:              MDM      Linna Hoff, MD 10/15/13 [phone]

## 2015-08-27 ENCOUNTER — Encounter (HOSPITAL_COMMUNITY): Payer: Self-pay | Admitting: *Deleted

## 2015-08-27 ENCOUNTER — Emergency Department (HOSPITAL_COMMUNITY)
Admission: EM | Admit: 2015-08-27 | Discharge: 2015-08-27 | Disposition: A | Discharge status: Home / Self Care | Payer: 59 | Attending: Emergency Medicine | Admitting: Emergency Medicine

## 2015-08-27 ENCOUNTER — Emergency Department (HOSPITAL_COMMUNITY): Payer: 59

## 2015-08-27 DIAGNOSIS — Z79899 Other long term (current) drug therapy: Secondary | ICD-10-CM | POA: Insufficient documentation

## 2015-08-27 DIAGNOSIS — R51 Headache: Secondary | ICD-10-CM

## 2015-08-27 DIAGNOSIS — Z9889 Other specified postprocedural states: Secondary | ICD-10-CM | POA: Diagnosis not present

## 2015-08-27 DIAGNOSIS — Z8659 Personal history of other mental and behavioral disorders: Secondary | ICD-10-CM | POA: Diagnosis not present

## 2015-08-27 DIAGNOSIS — Z79818 Long term (current) use of other agents affecting estrogen receptors and estrogen levels: Secondary | ICD-10-CM | POA: Insufficient documentation

## 2015-08-27 DIAGNOSIS — Z87728 Personal history of other specified (corrected) congenital malformations of nervous system and sense organs: Secondary | ICD-10-CM | POA: Diagnosis not present

## 2015-08-27 DIAGNOSIS — R519 Headache, unspecified: Secondary | ICD-10-CM

## 2015-08-27 DIAGNOSIS — Z720 Tobacco use: Secondary | ICD-10-CM | POA: Diagnosis not present

## 2015-08-27 DIAGNOSIS — G43909 Migraine, unspecified, not intractable, without status migrainosus: Secondary | ICD-10-CM | POA: Diagnosis not present

## 2015-08-27 LAB — CBC WITH DIFFERENTIAL/PLATELET
BASOS PCT: 0 %
Basophils Absolute: 0 10*3/uL (ref 0.0–0.1)
Eosinophils Absolute: 0.2 10*3/uL (ref 0.0–0.7)
Eosinophils Relative: 2 %
HEMATOCRIT: 44.3 % (ref 36.0–46.0)
Hemoglobin: 14.7 g/dL (ref 12.0–15.0)
Lymphocytes Relative: 29 %
Lymphs Abs: 2.9 10*3/uL (ref 0.7–4.0)
MCH: 28.2 pg (ref 26.0–34.0)
MCHC: 33.2 g/dL (ref 30.0–36.0)
MCV: 84.9 fL (ref 78.0–100.0)
MONO ABS: 0.7 10*3/uL (ref 0.1–1.0)
MONOS PCT: 7 %
NEUTROS ABS: 6.2 10*3/uL (ref 1.7–7.7)
Neutrophils Relative %: 62 %
Platelets: 305 10*3/uL (ref 150–400)
RBC: 5.22 MIL/uL — ABNORMAL HIGH (ref 3.87–5.11)
RDW: 14.7 % (ref 11.5–15.5)
WBC: 10 10*3/uL (ref 4.0–10.5)

## 2015-08-27 LAB — BASIC METABOLIC PANEL
Anion gap: 9 (ref 5–15)
BUN: 12 mg/dL (ref 6–20)
CALCIUM: 9.2 mg/dL (ref 8.9–10.3)
CO2: 25 mmol/L (ref 22–32)
CREATININE: 0.87 mg/dL (ref 0.44–1.00)
Chloride: 104 mmol/L (ref 101–111)
GFR calc non Af Amer: >60 mL/min (ref >60)
GLUCOSE: 85 mg/dL (ref 65–99)
Potassium: 3.6 mmol/L (ref 3.5–5.1)
Sodium: 138 mmol/L (ref 135–145)

## 2015-08-27 MED ORDER — DEXTROSE 5 % IV SOLN
500.0000 mg | Freq: Once | INTRAVENOUS | Status: AC
  Administered 2015-08-27: 500 mg via INTRAVENOUS
  Filled 2015-08-27: qty 5

## 2015-08-27 MED ORDER — GADOBENATE DIMEGLUMINE 529 MG/ML IV SOLN
15.0000 mL | Freq: Once | INTRAVENOUS | Status: AC | PRN
  Administered 2015-08-27: 15 mL via INTRAVENOUS

## 2015-08-27 MED ORDER — METOCLOPRAMIDE HCL 5 MG/ML IJ SOLN
10.0000 mg | Freq: Once | INTRAMUSCULAR | Status: AC
  Administered 2015-08-27: 10 mg via INTRAVENOUS
  Filled 2015-08-27: qty 2

## 2015-08-27 MED ORDER — KETOROLAC TROMETHAMINE 30 MG/ML IJ SOLN
15.0000 mg | Freq: Once | INTRAMUSCULAR | Status: AC
  Administered 2015-08-27: 15 mg via INTRAVENOUS
  Filled 2015-08-27: qty 1

## 2015-08-27 MED ORDER — IBUPROFEN 800 MG PO TABS: ORAL_TABLET | ORAL | Status: DC

## 2015-08-27 MED ORDER — DIPHENHYDRAMINE HCL 50 MG/ML IJ SOLN
25.0000 mg | Freq: Once | INTRAMUSCULAR | Status: AC
  Administered 2015-08-27: 25 mg via INTRAVENOUS
  Filled 2015-08-27: qty 1

## 2015-08-27 NOTE — ED Notes (Signed)
Pt return from MRI

## 2015-08-27 NOTE — ED Notes (Signed)
Family at bedside. 

## 2015-08-27 NOTE — ED Provider Notes (Signed)
MRI of the brain showed old surgeries otherwise unremarkable. I spoke with the neuro hospitalist and he suggested since the patient was feeling better that she could be discharged home with follow-up with family doctor  Bethann Berkshire, MD 08/27/15 2119

## 2015-08-27 NOTE — ED Notes (Signed)
MD at bedside. 

## 2015-08-27 NOTE — Discharge Instructions (Signed)
Follow up with your md next week. °

## 2015-08-27 NOTE — ED Notes (Signed)
Patient transported to CT 

## 2015-08-27 NOTE — ED Provider Notes (Signed)
CSN: [id number]     Arrival date & time 08/27/15  1310 History   First MD Initiated Contact with Patient 08/27/15 1350     Chief Complaint  Patient presents with  . Headache, hx brain sugery      (Consider location/radiation/quality/duration/timing/severity/associated sxs/prior Treatment) HPI Comments: Pt comes in with cc of headaches. Hx of Chiari malformation s/p brain surgery from 3 years ago at [facility]. She comes in with headaches that are located behind the eyes and started Sunday night. The pain is constant, severe, 9/10, pressure type. She has associated tingling in both of the legs and ringing in the ear. Emesis x 1, no current nausea. No vision changes, confusion ,seizures. She had similar headache prior to surgery.   ROS 10 Systems reviewed and are negative for acute change except as noted in the HPI.     The history is provided by the patient.    Past Medical History  Diagnosis Date  . Vertigo   . Migraine   . Seizure   . Chiari malformation   . Bipolar 1 disorder    Past Surgical History  Procedure Laterality Date  . Abdominal hysterectomy    . Breast lumpectomy      bilateral   History reviewed. No pertinent family history. Social History  Substance Use Topics  . Smoking status: Current Some Day Smoker -- 0.50 packs/day    Types: Cigarettes  . Smokeless tobacco: Never Used  . Alcohol Use: Yes     Comment: occ   OB History    No data available     Review of Systems    Allergies  Shellfish allergy  Home Medications   Prior to Admission medications   Medication Sig Start Date End Date Taking? Authorizing Provider  aspirin-acetaminophen-caffeine (EXCEDRIN MIGRAINE) [phone] MG per tablet Take 2 tablets by mouth every 6 (six) hours as needed for headache or migraine.   Yes Historical Provider, MD  estradiol (ESTRACE) 1 MG tablet Take 1 mg by mouth daily.   Yes Historical Provider, MD  Multiple Vitamins-Minerals (MULTIVITAMIN ADULT PO) Take 1  tablet by mouth daily.   Yes Historical Provider, MD  diphenoxylate-atropine (LOMOTIL) 2.5-0.025 MG per tablet Take 2 tablets by mouth 3 (three) times daily. Patient not taking: Reported on 08/27/2015 10/15/13   Linna Hoff, MD  loratadine (CLARITIN) 10 MG tablet Take 1 tablet (10 mg total) by mouth daily. Patient not taking: Reported on 08/27/2015 05/15/13   Antony Madura, PA-C  ondansetron (ZOFRAN) 4 MG tablet Take 1 tablet (4 mg total) by mouth every 6 (six) hours. Patient not taking: Reported on 08/27/2015 10/15/13   Linna Hoff, MD   BP 103/59 mmHg  Pulse 58  Temp(Src) 98.6 F (37 C) (Oral)  Resp 16  Ht  (1.6 m)  Wt 160 lb (72.576 kg)  BMI 28.35 kg/m2  SpO2 100% Physical Exam  Constitutional: She is oriented to person, place, and time. She appears well-developed and well-nourished.  HENT:  Head: Normocephalic and atraumatic.  Eyes: EOM are normal. Pupils are equal, round, and reactive to light.  Neck: Neck supple.  Cardiovascular: Normal rate, regular rhythm and normal heart sounds.   No murmur heard. Pulmonary/Chest: Effort normal. No respiratory distress.  Abdominal: Soft. She exhibits no distension. There is no tenderness. There is no rebound and no guarding.  Neurological: She is alert and oriented to person, place, and time. No cranial nerve deficit. Coordination normal.  Cerebellar exam is normal (finger to nose) Sensory  exam normal for bilateral upper and lower extremities - and patient is able to discriminate between sharp and dull. Motor exam is 4+/5  Skin: Skin is warm and dry.  Nursing note and vitals reviewed.   ED Course  Procedures (including critical care time) Labs Review Labs Reviewed  CBC WITH DIFFERENTIAL/PLATELET - Abnormal; Notable for the following:    RBC 5.22 (*)    All other components within normal limits  BASIC METABOLIC PANEL    Imaging Review Ct Head Wo Contrast  08/27/2015   CLINICAL DATA:  Constant headaches since Sunday night.  Bilateral leg tingling and tinnitus. Patient describes history of Chiari malformation status post decompression surgery in 2014. Patient also describes throbbing pain behind eyes bilaterally.  EXAM: CT HEAD WITHOUT CONTRAST  TECHNIQUE: Contiguous axial images were obtained from the base of the skull through the vertex without intravenous contrast.  COMPARISON:  Head CTs dated 06/01/2012 and 11/25/2010.  FINDINGS: There are surgical changes of previous posterior fossa decompression, without evidence of surgical complicating feature. Ventricles are normal in size and configuration. Gray-white matter differentiation appears well preserved throughout. There is no mass, hemorrhage, edema, or other evidence of acute parenchymal abnormality. No extra-axial hemorrhage.  No acute osseous abnormality. Visualized upper paranasal sinuses are clear. Mastoid air cells are clear. Visualized periorbital and retro-orbital soft tissues are unremarkable.  IMPRESSION: 1. Surgical changes of previous posterior fossa decompression for Chiari malformation. No evidence of surgical complicating feature. 2. No acute findings. No intracranial mass, hemorrhage, or edema. Visualized paranasal sinuses are clear. Visualized periorbital and retro-orbital soft tissues are unremarkable.   Electronically Signed   By: Bary Richard M.D.   On: 08/27/2015 15:01   I have personally reviewed and evaluated these images and lab results as part of my medical decision-making.   EKG Interpretation None       MDM   Final diagnoses:  Bad headache  Headache    Pt comes in with cc of headaches.] She has non specific tingling in her legs and tinnitus. Neuro exam is non focal. CT head is neg. Pt given pain meds, and there is no relief.  She takes estrogen. ? If she can have thrombosis.  Will call neuro for their recs.   5:06 PM Spoke with neuro. MRI ordered. They recommended adding depakene. Headache went from 10/10 to 8/10. Dr. Estell Harpin to  follow. Neuro will review the mri as well.   Derwood Kaplan, MD 08/27/15 [phone]

## 2015-08-27 NOTE — ED Notes (Addendum)
Pt reports hx of chiari malformation, had MRI and 6 months later brain decompression surgery 03/2013. Pt reports presently constatnt headache, throbbing pain behind bil eyes, intermittent bil tinnitus, leg tingling. Pt reports these are similar symptoms she had before the past brain surgery. Pain 9/10. Vomited yesterday. Denies n/v today.     Pt now sees neurologist once a year, last saw him Dec 2015.

## 2015-08-27 NOTE — ED Notes (Signed)
Patient transported to MRI 

## 2015-08-27 NOTE — ED Notes (Signed)
MD at bedside. EDP NANAVANTI

## 2015-10-15 ENCOUNTER — Encounter (HOSPITAL_COMMUNITY): Payer: Self-pay | Admitting: Family Medicine

## 2015-10-15 ENCOUNTER — Emergency Department (HOSPITAL_COMMUNITY): Payer: 59

## 2015-10-15 ENCOUNTER — Emergency Department (HOSPITAL_COMMUNITY)
Admission: EM | Admit: 2015-10-15 | Discharge: 2015-10-15 | Disposition: A | Discharge status: Home / Self Care | Payer: 59 | Attending: Physician Assistant | Admitting: Physician Assistant

## 2015-10-15 DIAGNOSIS — G43909 Migraine, unspecified, not intractable, without status migrainosus: Secondary | ICD-10-CM | POA: Diagnosis not present

## 2015-10-15 DIAGNOSIS — Z8659 Personal history of other mental and behavioral disorders: Secondary | ICD-10-CM | POA: Diagnosis not present

## 2015-10-15 DIAGNOSIS — Y998 Other external cause status: Secondary | ICD-10-CM | POA: Insufficient documentation

## 2015-10-15 DIAGNOSIS — S3992XA Unspecified injury of lower back, initial encounter: Secondary | ICD-10-CM | POA: Insufficient documentation

## 2015-10-15 DIAGNOSIS — Z720 Tobacco use: Secondary | ICD-10-CM | POA: Insufficient documentation

## 2015-10-15 DIAGNOSIS — Y92410 Unspecified street and highway as the place of occurrence of the external cause: Secondary | ICD-10-CM | POA: Diagnosis not present

## 2015-10-15 DIAGNOSIS — Q0700 Arnold-Chiari syndrome without spina bifida or hydrocephalus: Secondary | ICD-10-CM | POA: Diagnosis not present

## 2015-10-15 DIAGNOSIS — Z79899 Other long term (current) drug therapy: Secondary | ICD-10-CM | POA: Diagnosis not present

## 2015-10-15 DIAGNOSIS — Y9389 Activity, other specified: Secondary | ICD-10-CM | POA: Diagnosis not present

## 2015-10-15 MED ORDER — ACETAMINOPHEN 325 MG PO TABS
650.0000 mg | ORAL_TABLET | Freq: Once | ORAL | Status: AC
Start: 2015-10-15 — End: 2015-10-15
  Administered 2015-10-15: 650 mg via ORAL
  Filled 2015-10-15: qty 2

## 2015-10-15 MED ORDER — KETOROLAC TROMETHAMINE 60 MG/2ML IM SOLN
60.0000 mg | Freq: Once | INTRAMUSCULAR | Status: AC
  Administered 2015-10-15: 60 mg via INTRAMUSCULAR
  Filled 2015-10-15: qty 2

## 2015-10-15 MED ORDER — IBUPROFEN 800 MG PO TABS: 800.0000 mg | ORAL_TABLET | Freq: Three times a day (TID) | ORAL | Status: DC

## 2015-10-15 MED ORDER — ACETAMINOPHEN 500 MG PO TABS: 500.0000 mg | ORAL_TABLET | Freq: Four times a day (QID) | ORAL | Status: DC | PRN

## 2015-10-15 NOTE — Discharge Instructions (Signed)
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
## 2015-10-15 NOTE — ED Notes (Signed)
PA at bedside.

## 2015-10-15 NOTE — ED Notes (Signed)
PT HAS HAD A HYSTERECTOMY.
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
## 2016-05-29 ENCOUNTER — Emergency Department (HOSPITAL_COMMUNITY): Payer: 59

## 2016-05-29 ENCOUNTER — Encounter (HOSPITAL_COMMUNITY): Payer: Self-pay | Admitting: Emergency Medicine

## 2016-05-29 ENCOUNTER — Emergency Department (HOSPITAL_COMMUNITY)
Admission: EM | Admit: 2016-05-29 | Discharge: 2016-05-29 | Disposition: A | Discharge status: Home / Self Care | Payer: 59 | Attending: Emergency Medicine | Admitting: Emergency Medicine

## 2016-05-29 ENCOUNTER — Other Ambulatory Visit: Payer: Self-pay

## 2016-05-29 DIAGNOSIS — R42 Dizziness and giddiness: Secondary | ICD-10-CM | POA: Diagnosis not present

## 2016-05-29 DIAGNOSIS — F17210 Nicotine dependence, cigarettes, uncomplicated: Secondary | ICD-10-CM | POA: Diagnosis not present

## 2016-05-29 DIAGNOSIS — F319 Bipolar disorder, unspecified: Secondary | ICD-10-CM | POA: Insufficient documentation

## 2016-05-29 DIAGNOSIS — Z7982 Long term (current) use of aspirin: Secondary | ICD-10-CM | POA: Insufficient documentation

## 2016-05-29 DIAGNOSIS — R079 Chest pain, unspecified: Secondary | ICD-10-CM | POA: Diagnosis not present

## 2016-05-29 DIAGNOSIS — E876 Hypokalemia: Secondary | ICD-10-CM | POA: Diagnosis not present

## 2016-05-29 DIAGNOSIS — G43909 Migraine, unspecified, not intractable, without status migrainosus: Secondary | ICD-10-CM | POA: Diagnosis present

## 2016-05-29 LAB — I-STAT TROPONIN, ED: Troponin i, poc: 0.01 ng/mL (ref 0.00–0.08)

## 2016-05-29 LAB — BASIC METABOLIC PANEL
Anion gap: 7 (ref 5–15)
BUN: 6 mg/dL (ref 6–20)
CO2: 27 mmol/L (ref 22–32)
Calcium: 9.5 mg/dL (ref 8.9–10.3)
Chloride: 105 mmol/L (ref 101–111)
Creatinine, Ser: 0.85 mg/dL (ref 0.44–1.00)
GFR calc non Af Amer: >60 mL/min (ref >60)
Glucose, Bld: 100 mg/dL — ABNORMAL HIGH (ref 65–99)
Potassium: 2.8 mmol/L — ABNORMAL LOW (ref 3.5–5.1)
SODIUM: 139 mmol/L (ref 135–145)

## 2016-05-29 LAB — CBC
HEMATOCRIT: 43.6 % (ref 36.0–46.0)
Hemoglobin: 15.4 g/dL — ABNORMAL HIGH (ref 12.0–15.0)
MCH: 28.6 pg (ref 26.0–34.0)
MCHC: 35.3 g/dL (ref 30.0–36.0)
MCV: 81 fL (ref 78.0–100.0)
Platelets: 299 10*3/uL (ref 150–400)
RBC: 5.38 MIL/uL — AB (ref 3.87–5.11)
RDW: 13.8 % (ref 11.5–15.5)
WBC: 15.5 10*3/uL — AB (ref 4.0–10.5)

## 2016-05-29 LAB — D-DIMER, QUANTITATIVE (NOT AT ARMC)

## 2016-05-29 MED ORDER — KETOROLAC TROMETHAMINE 30 MG/ML IJ SOLN
30.0000 mg | Freq: Once | INTRAMUSCULAR | Status: AC
  Administered 2016-05-29: 30 mg via INTRAVENOUS
  Filled 2016-05-29: qty 1

## 2016-05-29 MED ORDER — POTASSIUM CHLORIDE CRYS ER 20 MEQ PO TBCR
40.0000 meq | EXTENDED_RELEASE_TABLET | Freq: Once | ORAL | Status: AC
  Administered 2016-05-29: 40 meq via ORAL
  Filled 2016-05-29: qty 2

## 2016-05-29 MED ORDER — SODIUM CHLORIDE 0.9 % IV BOLUS (SEPSIS)
1000.0000 mL | Freq: Once | INTRAVENOUS | Status: AC
  Administered 2016-05-29: 1000 mL via INTRAVENOUS

## 2016-05-29 NOTE — ED Provider Notes (Signed)
CSN: [id number]     Arrival date & time 05/29/16  1158 History   First MD Initiated Contact with Patient 05/29/16 1216     Chief Complaint  Patient presents with  . Chest Pain  . Migraine     (Consider location/radiation/quality/duration/timing/severity/associated sxs/prior Treatment) HPI Comments: Patient presents with chest pain. She has a history of migraines bipolar disorder and seizures. She has had a 2 day history of worsening pain to her left chest. It's been constant but today it was worse. It's a piercing type pain. It's nonradiating. She denies any shortness of breath. It's worse with cough and deep breathing. It's also somewhat worse with movement of her left arm. She denies any cough or chest congestion. No fevers. She's had some lightheadedness and also has an associated migraine. She states she has a history of migraines in her headache today feel similar to her prior migraines. She does smoke cigars 2-3 a day. She denies any leg pain or swelling. No prior history of DVT. No prior history of cardiac problems.  Patient is a 35 y.o. female presenting with chest pain and migraines.  Chest Pain Associated symptoms: headache   Associated symptoms: no abdominal pain, no back pain, no cough, no diaphoresis, no dizziness, no fatigue, no fever, no nausea, no numbness, no shortness of breath, not vomiting and no weakness   Migraine Associated symptoms include chest pain and headaches. Pertinent negatives include no abdominal pain and no shortness of breath.    Past Medical History  Diagnosis Date  . Vertigo   . Migraine   . Seizure (HCC)   . Chiari malformation   . Bipolar 1 disorder [facility])    Past Surgical History  Procedure Laterality Date  . Abdominal hysterectomy    . Breast lumpectomy      bilateral   No family history on file. Social History  Substance Use Topics  . Smoking status: Current Some Day Smoker -- 0.50 packs/day    Types: Cigarettes  . Smokeless tobacco:  Never Used  . Alcohol Use: Yes     Comment: occ   OB History    No data available     Review of Systems  Constitutional: Negative for fever, chills, diaphoresis and fatigue.  HENT: Negative for congestion, rhinorrhea and sneezing.   Eyes: Negative.   Respiratory: Negative for cough, chest tightness and shortness of breath.   Cardiovascular: Positive for chest pain. Negative for leg swelling.  Gastrointestinal: Negative for nausea, vomiting, abdominal pain, diarrhea and blood in stool.  Genitourinary: Negative for frequency, hematuria, flank pain and difficulty urinating.  Musculoskeletal: Negative for back pain and arthralgias.  Skin: Negative for rash.  Neurological: Positive for light-headedness and headaches. Negative for dizziness, speech difficulty, weakness and numbness.      Allergies  Shellfish allergy  Home Medications   Prior to Admission medications   Medication Sig Start Date End Date Taking? Authorizing Provider  aspirin EC 81 MG tablet Take 324 mg by mouth once.    Yes Historical Provider, MD  acetaminophen (TYLENOL) 500 MG tablet Take 1 tablet (500 mg total) by mouth every 6 (six) hours as needed. Patient not taking: Reported on 05/29/2016 10/15/15   Cheri Fowler, PA-C  diphenoxylate-atropine (LOMOTIL) 2.5-0.025 MG per tablet Take 2 tablets by mouth 3 (three) times daily. Patient not taking: Reported on 08/27/2015 10/15/13   Linna Hoff, MD  loratadine (CLARITIN) 10 MG tablet Take 1 tablet (10 mg total) by mouth daily. Patient not taking: Reported  on 08/27/2015 05/15/13   Antony MaduraKelly Humes, PA-C   BP 130/99 mmHg  Pulse 80  Temp(Src) 98.6 F (37 C) (Oral)  Resp 14  Ht 5\' 3"  (1.6 m)  Wt 160 lb (72.576 kg)  BMI 28.35 kg/m2  SpO2 99% Physical Exam  Constitutional: She is oriented to person, place, and time. She appears well-developed and well-nourished.  HENT:  Head: Normocephalic and atraumatic.  Eyes: Pupils are equal, round, and reactive to light.  Neck: Normal  range of motion. Neck supple.  Cardiovascular: Normal rate, regular rhythm and normal heart sounds.   Pulmonary/Chest: Effort normal and breath sounds normal. No respiratory distress. She has no wheezes. She has no rales. She exhibits tenderness (Pt winces when I palpate the left chest wall.  no crepitus or deformity).  Abdominal: Soft. Bowel sounds are normal. There is no tenderness. There is no rebound and no guarding.  Musculoskeletal: Normal range of motion. She exhibits no edema.  No edema or calf tenderness  Lymphadenopathy:    She has no cervical adenopathy.  Neurological: She is alert and oriented to person, place, and time.  Skin: Skin is warm and dry. No rash noted.  Psychiatric: She has a normal mood and affect.    ED Course  Procedures (including critical care time) Labs Review Results for orders placed or performed during the hospital encounter of 05/29/16  Basic metabolic panel  Result Value Ref Range   Sodium 139 135 - 145 mmol/L   Potassium 2.8 (L) 3.5 - 5.1 mmol/L   Chloride 105 101 - 111 mmol/L   CO2 27 22 - 32 mmol/L   Glucose, Bld 100 (H) 65 - 99 mg/dL   BUN 6 6 - 20 mg/dL   Creatinine, Ser 0.450.85 0.44 - 1.00 mg/dL   Calcium 9.5 8.9 - 40.910.3 mg/dL   GFR calc non Af Amer >60 >60 mL/min   GFR calc Af Amer >60 >60 mL/min   Anion gap 7 5 - 15  CBC  Result Value Ref Range   WBC 15.5 (H) 4.0 - 10.5 K/uL   RBC 5.38 (H) 3.87 - 5.11 MIL/uL   Hemoglobin 15.4 (H) 12.0 - 15.0 g/dL   HCT 81.143.6 91.436.0 - 78.246.0 %   MCV 81.0 78.0 - 100.0 fL   MCH 28.6 26.0 - 34.0 pg   MCHC 35.3 30.0 - 36.0 g/dL   RDW 95.613.8 21.311.5 - 08.615.5 %   Platelets 299 150 - 400 K/uL  D-dimer, quantitative  Result Value Ref Range   D-Dimer, Quant <0.27 0.00 - 0.50 ug/mL-FEU  I-stat troponin, ED  Result Value Ref Range   Troponin i, poc 0.01 0.00 - 0.08 ng/mL   Comment 3           Dg Chest 2 View  05/29/2016  CLINICAL DATA:  LEFT side chest pain since Thursday evening, episode of dizziness when trying to  get up, smoker EXAM: CHEST  2 VIEW COMPARISON:  07/06/2012 FINDINGS: Normal heart size, mediastinal contours, and pulmonary vascularity. Mild peribronchial thickening. No pulmonary infiltrate, pleural effusion, or pneumothorax. Bones unremarkable. IMPRESSION: Bronchitic changes without infiltrate. Electronically Signed   By: Ulyses SouthwardMark  Boles M.D.   On: 05/29/2016 12:47      Imaging Review Dg Chest 2 View  05/29/2016  CLINICAL DATA:  LEFT side chest pain since Thursday evening, episode of dizziness when trying to get up, smoker EXAM: CHEST  2 VIEW COMPARISON:  07/06/2012 FINDINGS: Normal heart size, mediastinal contours, and pulmonary vascularity. Mild peribronchial thickening. No  pulmonary infiltrate, pleural effusion, or pneumothorax. Bones unremarkable. IMPRESSION: Bronchitic changes without infiltrate. Electronically Signed   By: Ulyses SouthwardMark  Boles M.D.   On: 05/29/2016 12:47   I have personally reviewed and evaluated these images and lab results as part of my medical decision-making.   EKG Interpretation   Date/Time:  Saturday May 29 2016 12:08:47 EDT Ventricular Rate:  77 PR Interval:    QRS Duration: 76 QT Interval:  386 QTC Calculation: 437 R Axis:   62 Text Interpretation:  Sinus rhythm Borderline short PR interval since last  tracing no significant change Confirmed by Oluwatomisin Hustead  MD, Gissela Bloch ([id number]) on  05/29/2016 12:26:14 PM Also confirmed by Maicey Barrientez  MD, Cyra Spader ([id number]), editor  Whitney PostLOGAN, Cala BradfordKIMBERLY [phone]([id number])  on 05/29/2016 12:53:15 PM      MDM   Final diagnoses:  Chest pain, unspecified chest pain type  Hypokalemia    Patient presents with left-sided chest pain. It's reproducible on palpation. It's worse with reading and movement. Her chest x-ray is clear other than some bronchitic changes. She doesn't report any ongoing cough or fever. Her EKG doesn't show any ischemic changes. Her troponin is negative. Her d-dimer is normal and there is no other suggestions of pulmonary embolus. She was  discharged home in good condition. She was encouraged to follow-up with her PCP or return here as needed for any worsening symptoms. She does have some hypokalemia and was given dose of potassium in the ED. She was encouraged to have it rechecked by her PCP.    Rolan BuccoMelanie Zora Glendenning, MD 05/29/16 1401

## 2016-05-29 NOTE — ED Notes (Signed)
RN starting IV, drawing labs 

## 2016-05-29 NOTE — Discharge Instructions (Signed)
Chest Wall Pain Chest wall pain is pain in or around the bones and muscles of your chest. Sometimes, an injury causes this pain. Sometimes, the cause may not be known. This pain may take several weeks or longer to get better. HOME CARE Pay attention to any changes in your symptoms. Take these actions to help with your pain:  Rest as told by your doctor.  Avoid activities that cause pain. Try not to use your chest, belly (abdominal), or side muscles to lift heavy things.  If directed, apply ice to the painful area:  Put ice in a plastic bag.  Place a towel between your skin and the bag.  Leave the ice on for 20 minutes, 2-3 times per day.  Take over-the-counter and prescription medicines only as told by your doctor.  Do not use tobacco products, including cigarettes, chewing tobacco, and e-cigarettes. If you need help quitting, ask your doctor.  Keep all follow-up visits as told by your doctor. This is important. GET HELP IF:  You have a fever.  Your chest pain gets worse.  You have new symptoms. GET HELP RIGHT AWAY IF:  You feel sick to your stomach (nauseous) or you throw up (vomit).  You feel sweaty or light-headed.  You have a cough with phlegm (sputum) or you cough up blood.  You are short of breath.   This information is not intended to replace advice given to you by your health care provider. Make sure you discuss any questions you have with your health care provider.   Document Released: 05/10/2008 Document Revised: 08/13/2015 Document Reviewed: 02/17/2015 Elsevier Interactive Patient Education 2016 [employer]  Hypokalemia Hypokalemia means that the amount of potassium in the blood is lower than normal.Potassium is a chemical, called an electrolyte, that helps regulate the amount of fluid in the body. It also stimulates muscle contraction and helps nerves function properly.Most of the body's potassium is inside of cells, and only a very small amount is in the  blood. Because the amount in the blood is so small, minor changes can be life-threatening. CAUSES  Antibiotics.  Diarrhea or vomiting.  Using laxatives too much, which can cause diarrhea.  Chronic kidney disease.  Water pills (diuretics).  Eating disorders (bulimia).  Low magnesium level.  Sweating a lot. SIGNS AND SYMPTOMS  Weakness.  Constipation.  Fatigue.  Muscle cramps.  Mental confusion.  Skipped heartbeats or irregular heartbeat (palpitations).  Tingling or numbness. DIAGNOSIS  Your health care provider can diagnose hypokalemia with blood tests. In addition to checking your potassium level, your health care provider may also check other lab tests. TREATMENT Hypokalemia can be treated with potassium supplements taken by mouth or adjustments in your current medicines. If your potassium level is very low, you may need to get potassium through a vein (IV) and be monitored in the hospital. A diet high in potassium is also helpful. Foods high in potassium are:  Nuts, such as peanuts and pistachios.  Seeds, such as sunflower seeds and pumpkin seeds.  Peas, lentils, and lima beans.  Whole grain and bran cereals and breads.  Fresh fruit and vegetables, such as apricots, avocado, bananas, cantaloupe, kiwi, oranges, tomatoes, asparagus, and potatoes.  Orange and tomato juices.  Red meats.  Fruit yogurt. HOME CARE INSTRUCTIONS  Take all medicines as prescribed by your health care provider.  Maintain a healthy diet by including nutritious food, such as fruits, vegetables, nuts, whole grains, and lean meats.  If you are taking a laxative, be  sure to follow the directions on the label. SEEK MEDICAL CARE IF:  Your weakness gets worse.  You feel your heart pounding or racing.  You are vomiting or having diarrhea.  You are diabetic and having trouble keeping your blood glucose in the normal range. SEEK IMMEDIATE MEDICAL CARE IF:  You have chest pain,  shortness of breath, or dizziness.  You are vomiting or having diarrhea for more than 2 days.  You faint. MAKE SURE YOU:   Understand these instructions.  Will watch your condition.  Will get help right away if you are not doing well or get worse.   This information is not intended to replace advice given to you by your health care provider. Make sure you discuss any questions you have with your health care provider.   Document Released: 11/22/2005 Document Revised: 12/13/2014 Document Reviewed: 05/25/2013 Elsevier Interactive Patient Education [employer] Elsevier Inc.

## 2016-05-29 NOTE — ED Notes (Signed)
Pt c/o left sided chest pain since Thursday evening with a migraine. States she had one episode of dizziness when trying to get up. Denies SOB. Pt reports smoke 2-3 black and milds a day.

## 2018-06-15 ENCOUNTER — Other Ambulatory Visit: Payer: Self-pay

## 2018-06-15 DIAGNOSIS — F17210 Nicotine dependence, cigarettes, uncomplicated: Secondary | ICD-10-CM | POA: Insufficient documentation

## 2018-06-15 DIAGNOSIS — R51 Headache: Secondary | ICD-10-CM | POA: Insufficient documentation

## 2018-06-15 NOTE — ED Triage Notes (Signed)
Pt reports she has had a headache off and on for approximately 4 days. Pt reports coughing and sneezing make it worse. Pt reports she tried taking Excedrin and ibuprofen with no relief.

## 2018-06-16 ENCOUNTER — Emergency Department (HOSPITAL_COMMUNITY)
Admission: EM | Admit: 2018-06-16 | Discharge: 2018-06-16 | Disposition: A | Discharge status: Home / Self Care | Payer: 59 | Attending: Emergency Medicine | Admitting: Emergency Medicine

## 2018-06-16 ENCOUNTER — Emergency Department (HOSPITAL_COMMUNITY): Payer: 59

## 2018-06-16 DIAGNOSIS — R51 Headache: Secondary | ICD-10-CM

## 2018-06-16 DIAGNOSIS — R519 Headache, unspecified: Secondary | ICD-10-CM

## 2018-06-16 MED ORDER — METOCLOPRAMIDE HCL 5 MG/ML IJ SOLN
10.0000 mg | Freq: Once | INTRAMUSCULAR | Status: AC
  Administered 2018-06-16: 10 mg via INTRAVENOUS
  Filled 2018-06-16: qty 2

## 2018-06-16 MED ORDER — SODIUM CHLORIDE 0.9 % IV BOLUS
1000.0000 mL | Freq: Once | INTRAVENOUS | Status: AC
  Administered 2018-06-16: 1000 mL via INTRAVENOUS

## 2018-06-16 MED ORDER — DIPHENHYDRAMINE HCL 50 MG/ML IJ SOLN
25.0000 mg | Freq: Once | INTRAMUSCULAR | Status: AC
  Administered 2018-06-16: 25 mg via INTRAVENOUS
  Filled 2018-06-16: qty 1

## 2018-06-16 MED ORDER — KETOROLAC TROMETHAMINE 15 MG/ML IJ SOLN
15.0000 mg | Freq: Once | INTRAMUSCULAR | Status: AC
  Administered 2018-06-16: 15 mg via INTRAVENOUS
  Filled 2018-06-16: qty 1

## 2018-06-16 NOTE — ED Notes (Signed)
Discharge instructions reviewed with pt. Pt verbalized understanding. Pt to follow up with PCP. PIV removed. Pt ambulatory to waiting room.  

## 2018-06-16 NOTE — ED Provider Notes (Signed)
[facility]-EMERGENCY DEPT Provider Note: Natasha DellJ. Lane Chad Donoghue, MD, FACEP  CSN: [id number] MRN: [id number] ARRIVAL: 06/15/18 at 2240 ROOM: WA23/WA23   CHIEF COMPLAINT  Headache   HISTORY OF PRESENT ILLNESS  06/16/18 2:13 AM Natasha Hutchinson is a 37 y.o. female with a history of migraines and Chiari malformation.  She is here with a headache off and on for the past 4 days.  It is worse with coughing or sneezing.  She has been taking Excedrin and ibuprofen with no relief.  She describes the pain as a deep ache in the middle of her head and rates her pain as an 8 out of 10.  She denies nausea or vomiting but is having photophobia.   Past Medical History:  Diagnosis Date  . Bipolar 1 disorder (HCC)   . Chiari malformation   . Migraine   . Seizure (HCC)   . Vertigo     Past Surgical History:  Procedure Laterality Date  . ABDOMINAL HYSTERECTOMY    . BREAST LUMPECTOMY     bilateral    No family history on file.  Social History   Tobacco Use  . Smoking status: Current Some Day Smoker    Packs/day: 0.50    Types: Cigarettes  . Smokeless tobacco: Never Used  Substance Use Topics  . Alcohol use: Yes    Comment: occ  . Drug use: Yes    Types: Marijuana    Comment: once a week    Prior to Admission medications   Medication Sig Start Date End Date Taking? Authorizing Provider  acetaminophen (TYLENOL) 500 MG tablet Take 1 tablet (500 mg total) by mouth every 6 (six) hours as needed. Patient not taking: Reported on 05/29/2016 10/15/15   Cheri Fowlerose, Kayla, PA-C  diphenoxylate-atropine (LOMOTIL) 2.5-0.025 MG per tablet Take 2 tablets by mouth 3 (three) times daily. Patient not taking: Reported on 08/27/2015 10/15/13   Linna HoffKindl, James D, MD  loratadine (CLARITIN) 10 MG tablet Take 1 tablet (10 mg total) by mouth daily. Patient not taking: Reported on 08/27/2015 05/15/13   Antony MaduraHumes, Kelly, PA-C    Allergies Shellfish allergy   REVIEW OF SYSTEMS  Negative except as noted here or in the History of Present  Illness.   PHYSICAL EXAMINATION  Initial Vital Signs Blood pressure 130/83, pulse 63, temperature 98.5 F (36.9 C), resp. rate 18, weight 80.5 kg (177 lb 6.4 oz), SpO2 100 %.  Examination General: Well-developed, well-nourished female in no acute distress; appearance consistent with age of record HENT: normocephalic; atraumatic Eyes: pupils equal, round and reactive to light; extraocular muscles intact; photophobia Neck: supple Heart: regular rate and rhythm Lungs: clear to auscultation bilaterally Abdomen: soft; nondistended; nontender; bowel sounds present Extremities: No deformity; full range of motion; pulses normal Neurologic: Awake, alert and oriented; motor function intact in all extremities and symmetric; no facial droop Skin: Warm and dry Psychiatric: Normal mood and affect   RESULTS  Summary of this visit's results, reviewed by myself:   EKG Interpretation  Date/Time:    Ventricular Rate:    PR Interval:    QRS Duration:   QT Interval:    QTC Calculation:   R Axis:     Text Interpretation:        Laboratory Studies: No results found for this or any previous visit (from the past 24 hour(s)). Imaging Studies: Ct Head Wo Contrast  Result Date: 06/16/2018 CLINICAL DATA:  Intermittent headache for 4 days. History of vertigo, seizures, migraine, Chiari decompression. EXAM: CT HEAD WITHOUT CONTRAST  TECHNIQUE: Contiguous axial images were obtained from the base of the skull through the vertex without intravenous contrast. COMPARISON:  MRI of the head August 27, 2015 FINDINGS: BRAIN: No intraparenchymal hemorrhage, mass effect nor midline shift. The ventricles and sulci are normal. No acute large vascular territory infarcts. No abnormal extra-axial fluid collections. Basal cisterns are patent. VASCULAR: Unremarkable. SKULL/SOFT TISSUES: No skull fracture. Status post suboccipital decompressive craniectomy. No significant soft tissue swelling. ORBITS/SINUSES: The  included ocular globes and orbital contents are normal.The mastoid aircells and included paranasal sinuses are well-aerated. OTHER: None. IMPRESSION: 1. No acute intracranial process. 2. Status post suboccipital decompressive craniectomy, otherwise negative non-contrast CT HEAD. Electronically Signed   By: Awilda Metro M.D.   On: 06/16/2018 04:17    ED COURSE and MDM  Nursing notes and initial vitals signs, including pulse oximetry, reviewed.  Vitals:   06/15/18 2252 06/16/18 0121  BP: (!) 146/94 130/83  Pulse: 78 63  Resp: 16 18  Temp: 98.5 F (36.9 C)   SpO2: 98% 100%  Weight: 80.5 kg (177 lb 6.4 oz)    5:04 AM Headache significantly improved.  Patient states she is ready to go home.  CT of the head reassuring showing no complication of past decompressive craniectomy.  PROCEDURES    ED DIAGNOSES     ICD-10-CM   1. Bad headache R51        Leesha Veno, MD 06/16/18 [phone]

## 2018-08-16 ENCOUNTER — Ambulatory Visit (INDEPENDENT_AMBULATORY_CARE_PROVIDER_SITE_OTHER): Payer: 59 | Admitting: Obstetrics

## 2018-08-16 ENCOUNTER — Encounter: Payer: Self-pay | Admitting: Obstetrics

## 2018-08-16 VITALS — BP 126/84 | HR 94 | Ht 63.0 in | Wt 172.7 lb

## 2018-08-16 DIAGNOSIS — N898 Other specified noninflammatory disorders of vagina: Secondary | ICD-10-CM | POA: Diagnosis not present

## 2018-08-16 DIAGNOSIS — Z01419 Encounter for gynecological examination (general) (routine) without abnormal findings: Secondary | ICD-10-CM | POA: Diagnosis not present

## 2018-08-16 DIAGNOSIS — F17210 Nicotine dependence, cigarettes, uncomplicated: Secondary | ICD-10-CM

## 2018-08-16 DIAGNOSIS — Z113 Encounter for screening for infections with a predominantly sexual mode of transmission: Secondary | ICD-10-CM | POA: Diagnosis not present

## 2018-08-16 DIAGNOSIS — E8941 Symptomatic postprocedural ovarian failure: Secondary | ICD-10-CM

## 2018-08-16 MED ORDER — VARENICLINE TARTRATE 0.5 MG X 11 & 1 MG X 42 PO MISC
ORAL | 0 refills | Status: AC
Start: 2018-08-16 — End: ?

## 2018-08-16 MED ORDER — ESTRADIOL 0.1 MG/24HR TD PTWK: 0.1000 mg | MEDICATED_PATCH | TRANSDERMAL | 12 refills | Status: AC

## 2018-08-16 MED ORDER — VARENICLINE TARTRATE 1 MG PO TABS: 1.0000 mg | ORAL_TABLET | Freq: Two times a day (BID) | ORAL | 1 refills | Status: AC

## 2018-08-16 NOTE — Progress Notes (Signed)
Subjective:        Natasha Hutchinson is a 37 y.o. female here for a routine exam.  Current complaints: Severe hot flashes, and vaginal dryness causing painful intercourse.  S/P TAH / BSO in 2010 for endometriosis.  She is also s/p breast lumpectomy for benign cysts.  Personal health questionnaire:  Is patient Ashkenazi Jewish, have a family history of breast and/or ovarian cancer: no Is there a family history of uterine cancer diagnosed at age < 27, gastrointestinal cancer, urinary tract cancer, family member who is a Personnel officer syndrome-associated carrier: no Is the patient overweight and hypertensive, family history of diabetes, personal history of gestational diabetes, preeclampsia or PCOS: no Is patient over 17, have PCOS,  family history of premature CHD under age 47, diabetes, smoke, have hypertension or peripheral artery disease:  no At any time, has a partner hit, kicked or otherwise hurt or frightened you?: no Over the past 2 weeks, have you felt down, depressed or hopeless?: no Over the past 2 weeks, have you felt little interest or pleasure in doing things?:no   Gynecologic History No LMP recorded. Patient has had a hysterectomy. Contraception: status post hysterectomy Last Pap: 2010. Results were: normal Last mammogram: ~ 2004. Results were: benign cyst  Obstetric History OB History  Gravida Para Term Preterm AB Living  1 1 1     1   SAB TAB Ectopic Multiple Live Births               # Outcome Date GA Lbr Len/2nd Weight Sex Delivery Anes PTL Lv  1 Term             Past Medical History:  Diagnosis Date  . Bipolar 1 disorder (HCC)   . Chiari malformation   . Migraine   . Seizure (HCC)   . Vaginal Pap smear, abnormal   . Vertigo     Past Surgical History:  Procedure Laterality Date  . ABDOMINAL HYSTERECTOMY    . BRAIN SURGERY    . BREAST LUMPECTOMY     bilateral     Current Outpatient Medications:  .  estradiol (CLIMARA) 0.1 mg/24hr patch, Place 1 patch (0.1 mg  total) onto the skin once a week., Disp: 4 patch, Rfl: 12 .  varenicline (CHANTIX CONTINUING MONTH PAK) 1 MG tablet, Take 1 tablet (1 mg total) by mouth 2 (two) times daily., Disp: 60 tablet, Rfl: 1 .  varenicline (CHANTIX STARTING MONTH PAK) 0.5 MG X 11 & 1 MG X 42 tablet, Take one 0.5 mg tablet by mouth once daily for 3 days, then increase to one 0.5 mg tablet twice daily for 4 days, then increase to one 1 mg tablet twice daily., Disp: 53 tablet, Rfl: 0 Allergies  Allergen Reactions  . Shellfish Allergy Hives and Itching    Social History   Tobacco Use  . Smoking status: Current Some Day Smoker    Packs/day: 0.50    Types: Cigarettes  . Smokeless tobacco: Never Used  Substance Use Topics  . Alcohol use: Yes    Comment: occ    Family History  Problem Relation Age of Onset  . Cancer Maternal Grandmother       Review of Systems  Constitutional: negative for fatigue and weight loss Respiratory: negative for cough and wheezing Cardiovascular: negative for chest pain, fatigue and palpitations Gastrointestinal: negative for abdominal pain and change in bowel habits Musculoskeletal:negative for myalgias Neurological: negative for gait problems and tremors Behavioral/Psych: negative for abusive relationship, depression Endocrine:  negative for temperature intolerance    Genitourinary:negative for abnormal menstrual periods, genital lesions, hot flashes, sexual problems and vaginal discharge Integument/breast: negative for breast lump, breast tenderness, nipple discharge and skin lesion(s)    Objective:       BP 126/84   Pulse 94   Ht 5\' 3"  (1.6 m)   Wt 172 lb 11.2 oz (78.3 kg)   BMI 30.59 kg/m  General:   alert  Skin:   no rash or abnormalities  Lungs:   clear to auscultation bilaterally  Heart:   regular rate and rhythm, S1, S2 normal, no murmur, click, rub or gallop  Breasts:   normal without suspicious masses, skin or nipple changes or axillary nodes  Abdomen:  normal  findings: no organomegaly, soft, non-tender and no hernia  Pelvis:  External genitalia: normal general appearance Urinary system: urethral meatus normal and bladder without fullness, nontender Vaginal: normal without tenderness, induration or masses Cervix: absent Adnexa: absent Uterus: absent   Lab Review Urine pregnancy test Labs reviewed yes Radiologic studies reviewed no  50% of 20 min visit spent on counseling and coordination of care.   Assessment:     1. Encounter for routine gynecological examination with Papanicolaou smear of cervix  2. Vaginal discharge Rx: - Cervicovaginal ancillary only  3. Hot flashes due to surgical menopause Rx: - estradiol (CLIMARA) 0.1 mg/24hr patch; Place 1 patch (0.1 mg total) onto the skin once a week.  Dispense: 4 patch; Refill: 12  4. Tobacco dependence due to cigarettes Rx: - varenicline (CHANTIX STARTING MONTH PAK) 0.5 MG X 11 & 1 MG X 42 tablet; Take one 0.5 mg tablet by mouth once daily for 3 days, then increase to one 0.5 mg tablet twice daily for 4 days, then increase to one 1 mg tablet twice daily.  Dispense: 53 tablet; Refill: 0 - varenicline (CHANTIX CONTINUING MONTH PAK) 1 MG tablet; Take 1 tablet (1 mg total) by mouth 2 (two) times daily.  Dispense: 60 tablet; Refill: 1    Plan:    Education reviewed: calcium supplements, depression evaluation, low fat, low cholesterol diet, safe sex/STD prevention, self breast exams, smoking cessation, weight bearing exercise and HRT prescribed on the promise that she'll stop smoking. Follow up in: 6 months.   Meds ordered this encounter  Medications  . estradiol (CLIMARA) 0.1 mg/24hr patch    Sig: Place 1 patch (0.1 mg total) onto the skin once a week.    Dispense:  4 patch    Refill:  12  . varenicline (CHANTIX STARTING MONTH PAK) 0.5 MG X 11 & 1 MG X 42 tablet    Sig: Take one 0.5 mg tablet by mouth once daily for 3 days, then increase to one 0.5 mg tablet twice daily for 4 days, then  increase to one 1 mg tablet twice daily.    Dispense:  53 tablet    Refill:  0  . varenicline (CHANTIX CONTINUING MONTH PAK) 1 MG tablet    Sig: Take 1 tablet (1 mg total) by mouth 2 (two) times daily.    Dispense:  60 tablet    Refill:  1   No orders of the defined types were placed in this encounter.   Brock Bad MD 08-16-2018

## 2018-08-16 NOTE — Progress Notes (Signed)
New GYN presents for AEX.  C/o hot flashes, painful sex from vaginal dryness.  She had a total Hys in 2010 and Lumpectomy in 2004. Having intermittent sharp pain 8/10 in Breast.

## 2018-08-18 LAB — CERVICOVAGINAL ANCILLARY ONLY
BACTERIAL VAGINITIS: NEGATIVE
Candida vaginitis: NEGATIVE
Trichomonas: NEGATIVE

## 2018-09-18 IMAGING — CT CT HEAD W/O CM
3 series · 15 of 47 positions shown, 18 images · non-contrast
Comparison: MRI of the head August 27, 2015

CLINICAL DATA: Intermittent headache for 4 days. History of
vertigo, seizures, migraine, Chiari decompression.

EXAM:
CT HEAD WITHOUT CONTRAST
TECHNIQUE: Contiguous axial images were obtained from the base of the skull
through the vertex without intravenous contrast.

[Series 2: head wo · axial · 0.47mm/px · z∈[+1250,+1375]mm · 9 of 31 slices shown, 12 images]
[im 3/31  brain]
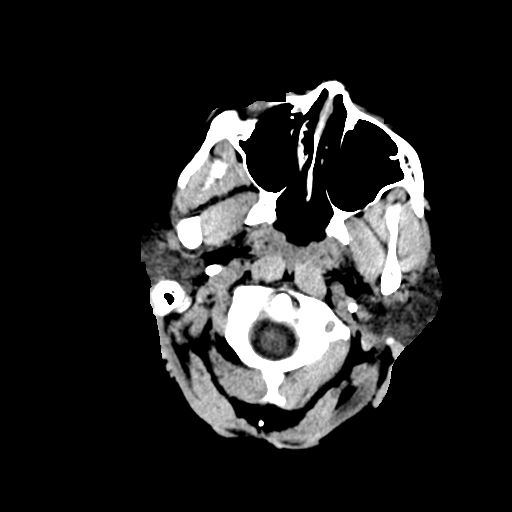
[im 3/31  bone]
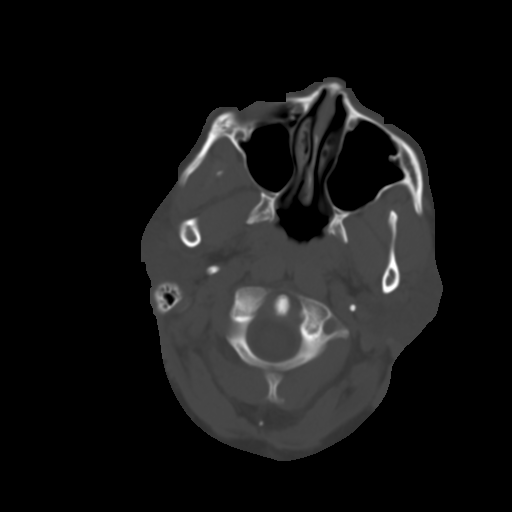
[im 6/31  brain]
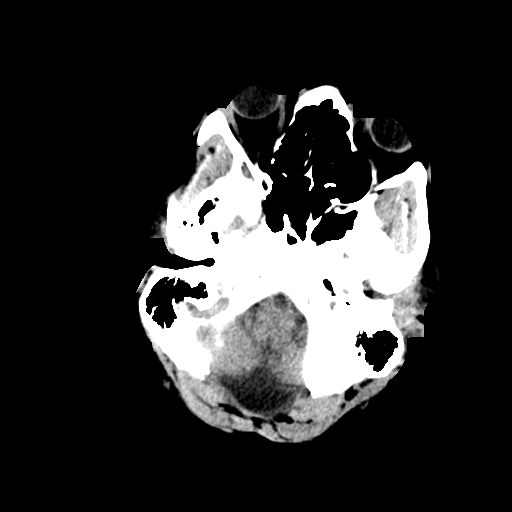
[im 9/31  brain]
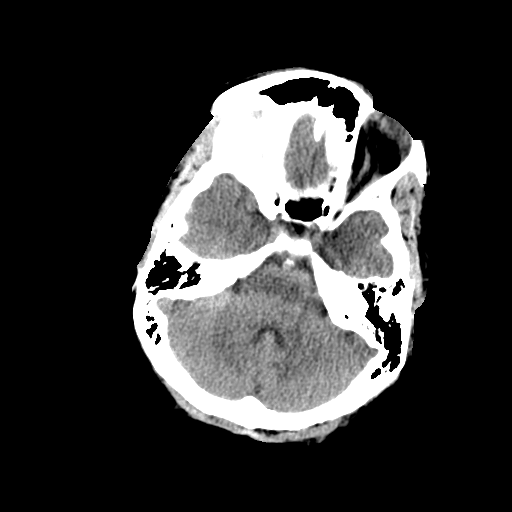
[im 12/31  brain]
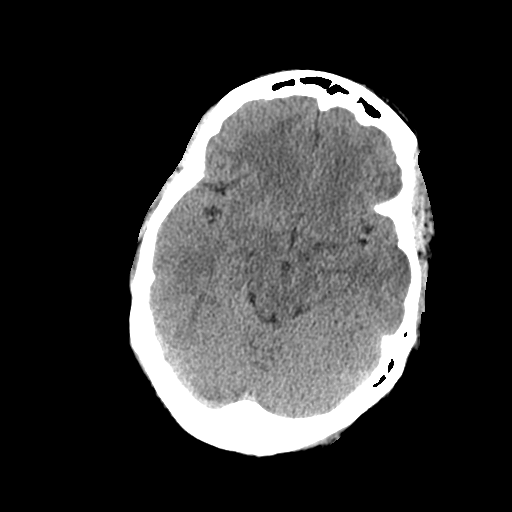
[im 16/31  brain]
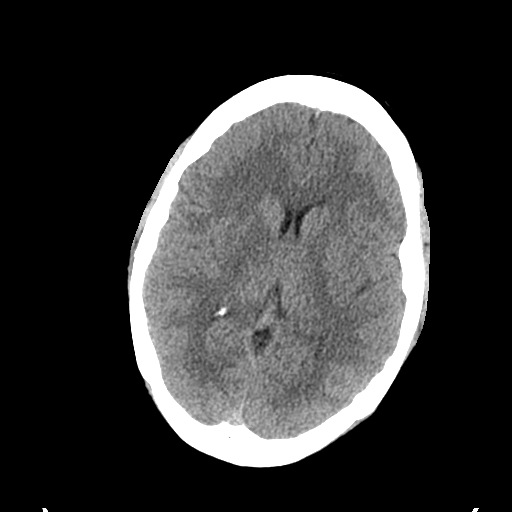
[im 16/31  bone]
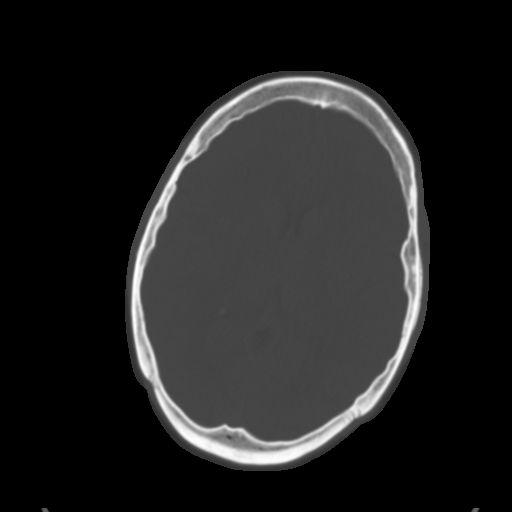
[im 19/31  brain]
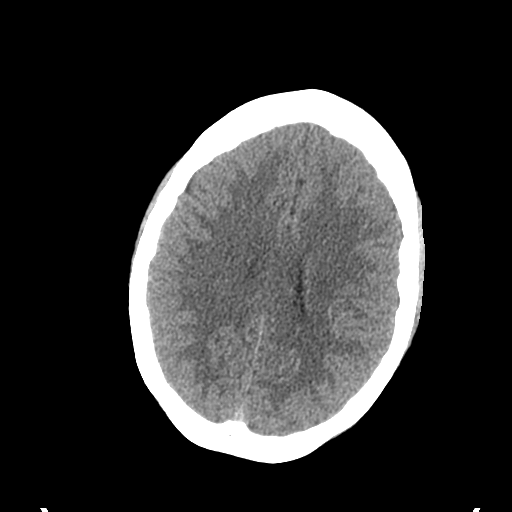
[im 22/31  brain]
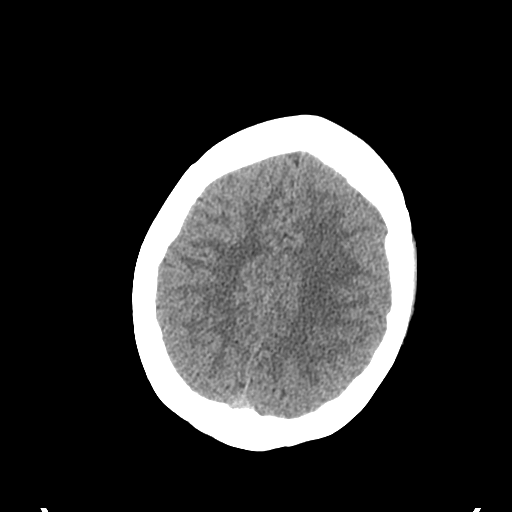
[im 25/31  brain]
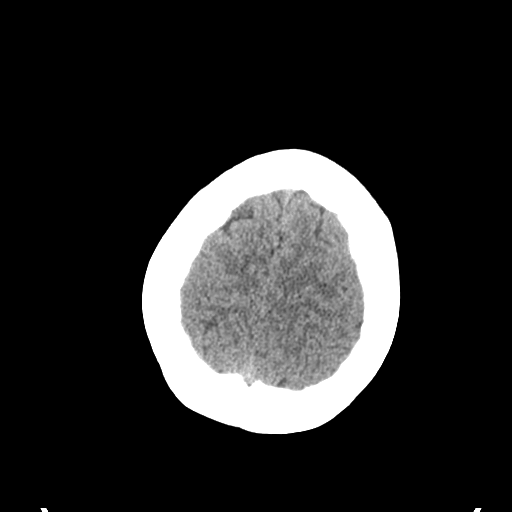
[im 28/31  brain]
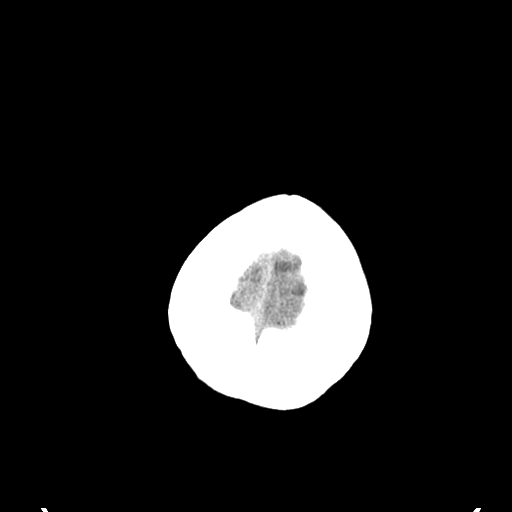
[im 28/31  bone]
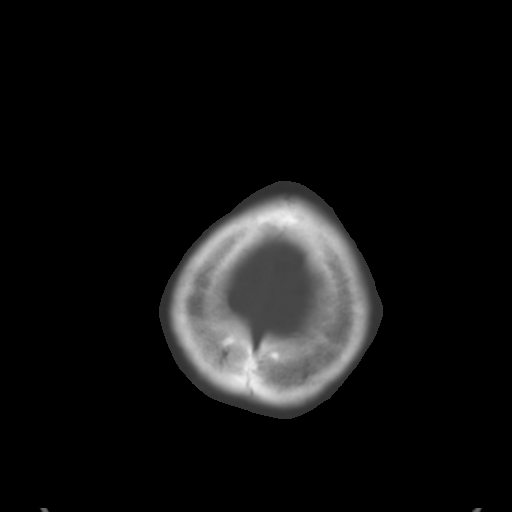

[Series 4: coronal soft tissue · coronal · 0.28mm/px · 3 of 64 slices shown]
[im 22/64  brain]
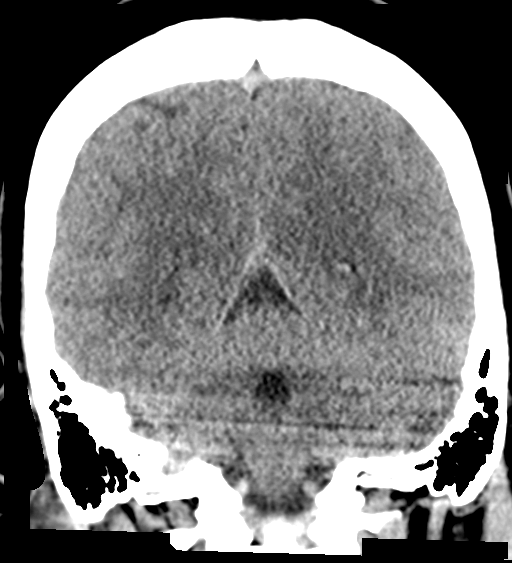
[im 29/64  brain]
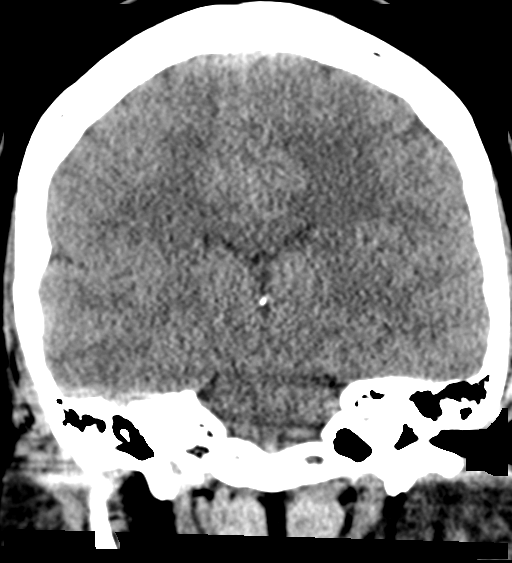
[im 36/64  brain]
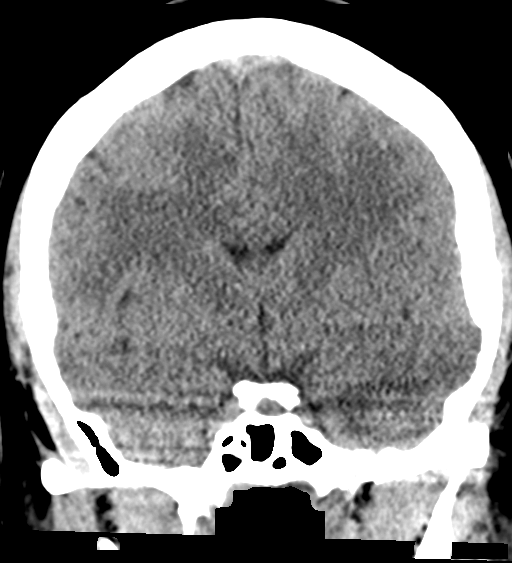

[Series 5: sagittal soft tissue · sagittal · 0.30mm/px · 3 of 48 slices shown]
[im 16/48  brain]
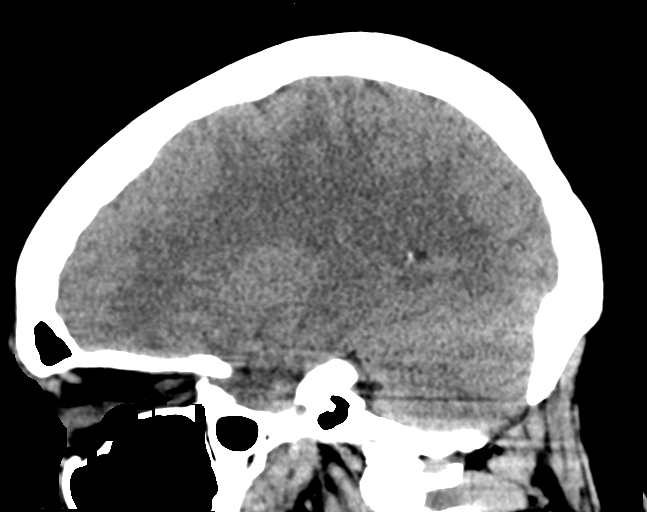
[im 24/48  brain]
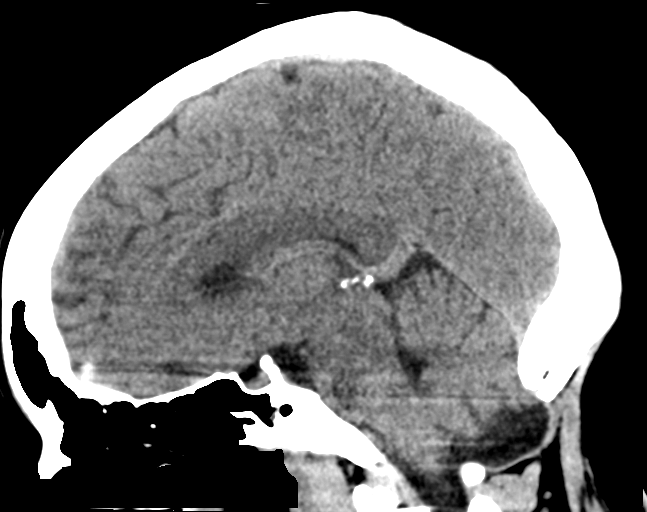
[im 32/48  brain]
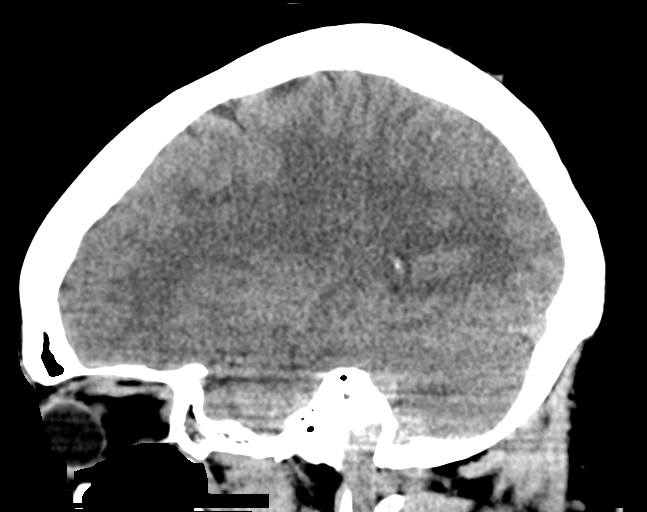

[15 of 47 positions shown; findings below may reference images not displayed]

FINDINGS: BRAIN: No intraparenchymal hemorrhage, mass effect nor midline
shift. The ventricles and sulci are normal. No acute large vascular
territory infarcts. No abnormal extra-axial fluid collections. Basal
cisterns are patent.

VASCULAR: Unremarkable.

SKULL/SOFT TISSUES: No skull fracture. Status post suboccipital
decompressive craniectomy. No significant soft tissue swelling.

ORBITS/SINUSES: The included ocular globes and orbital contents are
normal.The mastoid aircells and included paranasal sinuses are
well-aerated.

OTHER: None.
IMPRESSION: 1. No acute intracranial process.
2. Status post suboccipital decompressive craniectomy, otherwise
negative non-contrast CT HEAD.

## 2023-07-26 ENCOUNTER — Other Ambulatory Visit: Payer: Self-pay | Admitting: Internal Medicine

## 2023-07-27 LAB — COMPLETE METABOLIC PANEL WITH GFR
AG Ratio: 1.8 (calc) (ref 1.0–2.5)
ALT: 26 U/L (ref 6–29)
AST: 26 U/L (ref 10–30)
Albumin: 4.6 g/dL (ref 3.6–5.1)
Alkaline phosphatase (APISO): 83 U/L (ref 31–125)
BUN: 7 mg/dL (ref 7–25)
CO2: 22 mmol/L (ref 20–32)
Calcium: 9.9 mg/dL (ref 8.6–10.2)
Chloride: 105 mmol/L (ref 98–110)
Creat: 0.72 mg/dL (ref 0.50–0.99)
Globulin: 2.5 g/dL (ref 1.9–3.7)
Glucose, Bld: 72 mg/dL (ref 65–99)
Potassium: 3.5 mmol/L (ref 3.5–5.3)
Sodium: 142 mmol/L (ref 135–146)
Total Bilirubin: 0.4 mg/dL (ref 0.2–1.2)
Total Protein: 7.1 g/dL (ref 6.1–8.1)
eGFR: 108 mL/min/{1.73_m2} (ref >=60)

## 2023-07-27 LAB — CBC
HCT: 48 % — ABNORMAL HIGH (ref 35.0–45.0)
Hemoglobin: 15.8 g/dL — ABNORMAL HIGH (ref 11.7–15.5)
MCH: 29.4 pg (ref 27.0–33.0)
MCHC: 32.9 g/dL (ref 32.0–36.0)
MCV: 89.2 fL (ref 80.0–100.0)
MPV: 8.7 fL (ref 7.5–12.5)
Platelets: 290 10*3/uL (ref 140–400)
RBC: 5.38 10*6/uL — ABNORMAL HIGH (ref 3.80–5.10)
RDW: 13.8 % (ref 11.0–15.0)
WBC: 10.7 10*3/uL (ref 3.8–10.8)

## 2023-07-27 LAB — LIPID PANEL
Cholesterol: 264 mg/dL — ABNORMAL HIGH (ref <200)
HDL: 48 mg/dL — ABNORMAL LOW (ref >=50)
LDL Cholesterol (Calc): 173 mg/dL — ABNORMAL HIGH
Non-HDL Cholesterol (Calc): 216 mg/dL — ABNORMAL HIGH (ref <130)
Total CHOL/HDL Ratio: 5.5 (calc) — ABNORMAL HIGH (ref <5.0)
Triglycerides: 264 mg/dL — ABNORMAL HIGH (ref <150)

## 2023-07-27 LAB — VITAMIN D 25 HYDROXY (VIT D DEFICIENCY, FRACTURES): Vit D, 25-Hydroxy: 16 ng/mL — ABNORMAL LOW (ref 30–100)
